# Patient Record
Sex: Female | Born: 1995 | Race: Black or African American | Hispanic: No | Marital: Single | Smoking: Never smoker
Health system: Southern US, Community
[De-identification: ages and names within clinical notes are randomized; demographics above are authoritative.]

## PROBLEM LIST (undated history)

## (undated) ENCOUNTER — Inpatient Hospital Stay (HOSPITAL_COMMUNITY): Payer: Self-pay

## (undated) DIAGNOSIS — A549 Gonococcal infection, unspecified: Secondary | ICD-10-CM

## (undated) DIAGNOSIS — J45909 Unspecified asthma, uncomplicated: Secondary | ICD-10-CM

## (undated) HISTORY — PX: WISDOM TOOTH EXTRACTION: SHX21

---

## 2017-08-12 ENCOUNTER — Encounter (HOSPITAL_COMMUNITY): Payer: Self-pay | Admitting: Emergency Medicine

## 2017-08-12 ENCOUNTER — Emergency Department (HOSPITAL_COMMUNITY)
Admission: EM | Admit: 2017-08-12 | Discharge: 2017-08-12 | Disposition: A | Discharge status: Home / Self Care | Payer: Self-pay | Attending: Emergency Medicine | Admitting: Emergency Medicine

## 2017-08-12 DIAGNOSIS — J020 Streptococcal pharyngitis: Secondary | ICD-10-CM | POA: Insufficient documentation

## 2017-08-12 LAB — RAPID STREP SCREEN (MED CTR MEBANE ONLY): Streptococcus, Group A Screen (Direct): POSITIVE — AB

## 2017-08-12 MED ORDER — PENICILLIN G BENZATHINE 1200000 UNIT/2ML IM SUSP
1.2000 10*6.[IU] | Freq: Once | INTRAMUSCULAR | Status: AC
  Administered 2017-08-12: 1.2 10*6.[IU] via INTRAMUSCULAR
  Filled 2017-08-12: qty 2

## 2017-08-12 NOTE — Discharge Instructions (Signed)
Return if any problems.

## 2017-08-12 NOTE — ED Provider Notes (Signed)
[facility] Provider Note   CSN: [id number] Arrival date & time: 08/12/17  1512     History   Chief Complaint Chief Complaint  Patient presents with  . Sore Throat    HPI Renee Glenn is a 21 y.o. female.  The history is provided by the patient. No language interpreter was used.  Sore Throat  This is a new problem. The current episode started 2 days ago. The problem occurs constantly. The problem has been gradually worsening. Nothing aggravates the symptoms. Nothing relieves the symptoms. She has tried nothing for the symptoms.  Pt here with friend who recently had strep  History reviewed. No pertinent past medical history.  There are no active problems to display for this patient.   Past Surgical History:  Procedure Laterality Date  . WISDOM TOOTH EXTRACTION      OB History    No data available       Home Medications    Prior to Admission medications   Not on File    Family History No family history on file.  Social History Social History  Substance Use Topics  . Smoking status: Not on file  . Smokeless tobacco: Not on file  . Alcohol use Not on file     Allergies   Patient has no known allergies.   Review of Systems Review of Systems  All other systems reviewed and are negative.    Physical Exam Updated Vital Signs BP 129/77 (BP Location: Left Arm)   Pulse 77   Temp 98.4 F (36.9 C) (Oral)   Resp 16   LMP 07/21/2017 (Approximate)   SpO2 100%   Physical Exam  Constitutional: She appears well-developed and well-nourished. No distress.  HENT:  Head: Normocephalic and atraumatic.  Right Ear: External ear normal.  Left Ear: External ear normal.  Nose: Nose normal.  Erythema throat, no exudate  Eyes: Conjunctivae are normal.  Neck: Neck supple.  Cardiovascular: Normal rate and regular rhythm.   No murmur heard. Pulmonary/Chest: Effort normal and breath sounds normal. No respiratory distress.  Abdominal: Soft. There is  no tenderness.  Musculoskeletal: She exhibits no edema.  Neurological: She is alert.  Skin: Skin is warm and dry.  Psychiatric: She has a normal mood and affect.  Nursing note and vitals reviewed.    ED Treatments / Results  Labs (all labs ordered are listed, but only abnormal results are displayed) Labs Reviewed  RAPID STREP SCREEN (NOT AT [facility]) - Abnormal; Notable for the following:       Result Value   Streptococcus, Group A Screen (Direct) POSITIVE (*)    All other components within normal limits    EKG  EKG Interpretation None       Radiology No results found.  Procedures Procedures (including critical care time)  Medications Ordered in ED Medications  penicillin g benzathine (BICILLIN LA) 1200000 UNIT/2ML injection 1.2 Million Units (1.2 Million Units Intramuscular Given 08/12/17 1817)     Initial Impression / Assessment and Plan / ED Course  I have reviewed the triage vital signs and the nursing notes.  Pertinent labs & imaging results that were available during my care of the patient were reviewed by me and considered in my medical decision making (see chart for details).       Final Clinical Impressions(s) / ED Diagnoses   Final diagnoses:  Strep pharyngitis   Meds ordered this encounter  Medications  . penicillin g benzathine (BICILLIN LA) 1200000 UNIT/2ML injection 1.2 Million Units  Order Specific Question:   Antibiotic Indication:    Answer:   Pharyngitis    New Prescriptions There are no discharge medications for this patient. An After Visit Summary was printed and given to the patient.   Elson Areas, PA-C 08/16/17 1608    Pricilla Loveless, MD 08/19/17 [phone]

## 2017-08-12 NOTE — ED Triage Notes (Signed)
Pt states that she has a friend that has strep and she thinks she has it now. Sore throat. Alert and oriented.

## 2017-12-24 NOTE — L&D Delivery Note (Addendum)
Patient: Renee ClintonShaitiyah Cowie MRN: [id number]  GBS status: negative, intrapartum antibiotics for chorio  Patient is a 22 y.o. now G1P1 s/p NSVD at 2763w6d, who was admitted for IOL due to SROM without SOL. SROM 76h 1062m prior to delivery with clear fluid.  Head delivered LOA. Nuchal cord x2 delivered through and reduced after delivery of the body. Infant with poor tone at delivery, clamped immediately and taken to warmer where she had spontaneous cry after suction and stimulation. Cord blood drawn. Placenta delivered spontaneously with gentle cord traction. Fundus firm with massage and Pitocin. Perineum inspected and found to have no laceration.  Delivery Note At 10:48 PM a viable female was delivered via Vaginal, Spontaneous (Presentation: cephalic; LOA ).  APGAR: 5, 8, 9; weight 3294g.   Placenta status: intact, 3 vessel cord. Sent to path to confirm chorio.    Anesthesia: Epidural  Episiotomy: None Lacerations: None Est. Blood Loss (mL): 292 mL   Mom to postpartum.  Baby to Couplet care / Skin to Skin. Resident was called back to room approximately 40 minutes after delivery for slow trickle of blood during fundal massage. Resolved by the time of arrival to room. Repeat exam without pooling of vaginal blood or lacerations.  Shawn RouteSarah A Peiffer 12/04/2018, 11:01 PM    OB FELLOW DELIVERY ATTESTATION  I was gloved and present for the delivery in its entirety, and I agree with the above resident's note.    Marcy Sirenatherine Wallace, D.O. OB Fellow  12/05/2018, 4:26 AM

## 2018-04-14 ENCOUNTER — Encounter (HOSPITAL_COMMUNITY): Payer: Self-pay

## 2018-04-14 ENCOUNTER — Ambulatory Visit (HOSPITAL_COMMUNITY)
Admission: EM | Admit: 2018-04-14 | Discharge: 2018-04-14 | Disposition: A | Discharge status: Home / Self Care | Payer: Self-pay | Attending: Family Medicine | Admitting: Family Medicine

## 2018-04-14 DIAGNOSIS — Z3A01 Less than 8 weeks gestation of pregnancy: Secondary | ICD-10-CM

## 2018-04-14 DIAGNOSIS — O210 Mild hyperemesis gravidarum: Secondary | ICD-10-CM

## 2018-04-14 DIAGNOSIS — O219 Vomiting of pregnancy, unspecified: Secondary | ICD-10-CM

## 2018-04-14 MED ORDER — ONDANSETRON 4 MG PO TBDP
4.0000 mg | ORAL_TABLET | Freq: Once | ORAL | Status: AC
  Administered 2018-04-14: 4 mg via ORAL

## 2018-04-14 MED ORDER — DOXYLAMINE SUCCINATE (SLEEP) 25 MG PO TABS: 12.5000 mg | ORAL_TABLET | Freq: Three times a day (TID) | ORAL | 0 refills | Status: DC | PRN

## 2018-04-14 MED ORDER — ONDANSETRON 4 MG PO TBDP
ORAL_TABLET | ORAL | Status: AC
  Filled 2018-04-14: qty 1

## 2018-04-14 MED ORDER — VITAMIN B-6 25 MG PO TABS: 25.0000 mg | ORAL_TABLET | Freq: Three times a day (TID) | ORAL | 0 refills | Status: DC | PRN

## 2018-04-14 NOTE — ED Provider Notes (Signed)
[facility]    CSN: [id number] Arrival date & time: 04/14/18  1038     History   Chief Complaint Chief Complaint  Patient presents with  . Emesis  . Routine Prenatal Visit    HPI Renee Glenn is a 22 y.o. female.   Floria presents with complaints of nausea and vomiting which have been persistent for the past week. She is approximately [redacted]w[redacted]d pregnant, LMP March 8. She states she has vomited approximately 12 times today. Causes her to feel short of breath at times. Upper abdomen feels sore from vomiting but otherwise with abdominal or pelvic pain. Without vaginal bleeding. Denies urinary symptoms and is still urinating regularly. Denies dizziness. Has been taking some fluids which stay down, has been drinking ginger ale. Has not taken any medications for symptoms. Has been taking a prenatal vitamin. This is her first pregnancy. Has first Ob appointment 5/13. Without contributing medical history.      ROS per HPI.      History reviewed. No pertinent past medical history.  There are no active problems to display for this patient.   Past Surgical History:  Procedure Laterality Date  . WISDOM TOOTH EXTRACTION      OB History    Gravida  1   Para      Term      Preterm      AB      Living        SAB      TAB      Ectopic      Multiple      Live Births               Home Medications    Prior to Admission medications   Medication Sig Start Date End Date Taking? Authorizing Provider  doxylamine, Sleep, (UNISOM) 25 MG tablet Take 0.5 tablets (12.5 mg total) by mouth 3 (three) times daily as needed. 04/14/18   Georgetta Haber, NP  vitamin B-6 (PYRIDOXINE) 25 MG tablet Take 1 tablet (25 mg total) by mouth 3 (three) times daily as needed. 04/14/18   Georgetta Haber, NP    Family History Family History  Problem Relation Age of Onset  . Healthy Mother   . Healthy Father     Social History Social History   Tobacco Use  . Smoking  status: Never Smoker  . Smokeless tobacco: Never Used  Substance Use Topics  . Alcohol use: Not Currently    Frequency: Never  . Drug use: Never     Allergies   Penicillins   Review of Systems Review of Systems   Physical Exam Triage Vital Signs ED Triage Vitals  Enc Vitals Group     BP 04/14/18 1141 136/80     Pulse Rate 04/14/18 1138 67     Resp 04/14/18 1138 18     Temp 04/14/18 1138 98.1 F (36.7 C)     Temp src --      SpO2 04/14/18 1138 100 %     Weight 04/14/18 1139 170 lb (77.1 kg)     Height --      Head Circumference --      Peak Flow --      Pain Score 04/14/18 1139 0     Pain Loc --      Pain Edu? --      Excl. in GC? --    No data found.  Updated Vital Signs BP 136/80   Pulse 67  Temp 98.1 F (36.7 C)   Resp 18   Wt 170 lb (77.1 kg)   LMP 07/21/2017 (Approximate)   SpO2 100%    Physical Exam  Constitutional: She is oriented to person, place, and time. She appears well-developed and well-nourished. No distress.  Cardiovascular: Normal rate, regular rhythm and normal heart sounds.  Pulmonary/Chest: Effort normal and breath sounds normal.  Abdominal: Soft. She exhibits no distension. There is no tenderness. There is no rigidity, no rebound, no guarding and no CVA tenderness.  Genitourinary:  Genitourinary Comments: Denies vaginal bleeding or discharge  Neurological: She is alert and oriented to person, place, and time.  Skin: Skin is warm and dry.     UC Treatments / Results  Labs (all labs ordered are listed, but only abnormal results are displayed) Labs Reviewed - No data to display  EKG None Radiology No results found.  Procedures Procedures (including critical care time)  Medications Ordered in UC Medications  ondansetron (ZOFRAN-ODT) disintegrating tablet 4 mg (has no administration in time range)     Initial Impression / Assessment and Plan / UC Course  I have reviewed the triage vital signs and the nursing  notes.  Pertinent labs & imaging results that were available during my care of the patient were reviewed by me and considered in my medical decision making (see chart for details).     Taking some fluids in clinic today. Ambulatory, without dizziness, vitals stable without tachycardia or tachypnea, normotensive. zofran provided in clinic today with recommendations for use of vitamin b6 and unisom for nausea at home. Return precautions provided. Continue to follow with Ob as already scheduled. Patient verbalized understanding and agreeable to plan.    Final Clinical Impressions(s) / UC Diagnoses   Final diagnoses:  Morning sickness    ED Discharge Orders        Ordered    doxylamine, Sleep, (UNISOM) 25 MG tablet  3 times daily PRN     04/14/18 1159    vitamin B-6 (PYRIDOXINE) 25 MG tablet  3 times daily PRN     04/14/18 1159       Controlled Substance Prescriptions [place] Controlled Substance Registry consulted? Not Applicable   Georgetta HaberBurky, Johnnisha Forton B, NP 04/14/18 1206

## 2018-04-14 NOTE — Discharge Instructions (Addendum)
For morning sickness: Doxylamine is available in some over-the-counter sleeping pills (eg, Unisom Sleep Tabs): One-half of the 25 mg over-the-counter tablet or two chewable 5 mg tablets can be used off-label as an antiemetic. In addition, pyridoxine (vitamin B6) 25 mg, also available over-the-counter, can be taken three or four times per day along with 12.5 mg of doxylamine   Small frequent sips of fluids.  See provided information for tips in regards to morning sickness. Tylenol as needed for pain. If worsening, develop dizziness, unable to urinate, weakness, pain, bleeding or otherwise worsening please go to er or womens er.

## 2018-04-14 NOTE — ED Triage Notes (Signed)
Pt presents six weeks pregnant. Complaints of constant vomiting x 1 week.

## 2018-04-15 ENCOUNTER — Encounter (HOSPITAL_COMMUNITY): Payer: Self-pay | Admitting: *Deleted

## 2018-04-15 ENCOUNTER — Other Ambulatory Visit: Payer: Self-pay

## 2018-04-15 ENCOUNTER — Inpatient Hospital Stay (HOSPITAL_COMMUNITY)
Admission: AD | Admit: 2018-04-15 | Discharge: 2018-04-15 | Disposition: A | Discharge status: Home / Self Care | Payer: Medicaid Other | Source: Ambulatory Visit | Attending: Obstetrics and Gynecology | Admitting: Obstetrics and Gynecology

## 2018-04-15 DIAGNOSIS — Z3A01 Less than 8 weeks gestation of pregnancy: Secondary | ICD-10-CM | POA: Insufficient documentation

## 2018-04-15 DIAGNOSIS — O210 Mild hyperemesis gravidarum: Secondary | ICD-10-CM | POA: Insufficient documentation

## 2018-04-15 DIAGNOSIS — O219 Vomiting of pregnancy, unspecified: Secondary | ICD-10-CM

## 2018-04-15 HISTORY — DX: Unspecified asthma, uncomplicated: J45.909

## 2018-04-15 LAB — URINALYSIS, ROUTINE W REFLEX MICROSCOPIC
BILIRUBIN URINE: NEGATIVE
GLUCOSE, UA: NEGATIVE mg/dL
Hgb urine dipstick: NEGATIVE
KETONES UR: 5 mg/dL — AB
LEUKOCYTES UA: NEGATIVE
Nitrite: NEGATIVE
PH: 7 (ref 5.0–8.0)
Protein, ur: 30 mg/dL — AB
SPECIFIC GRAVITY, URINE: 1.032 — AB (ref 1.005–1.030)

## 2018-04-15 LAB — BASIC METABOLIC PANEL
ANION GAP: 10 (ref 5–15)
BUN: 9 mg/dL (ref 6–20)
CO2: 20 mmol/L — AB (ref 22–32)
Calcium: 10 mg/dL (ref 8.9–10.3)
Chloride: 106 mmol/L (ref 101–111)
Creatinine, Ser: 0.72 mg/dL (ref 0.44–1.00)
GFR calc Af Amer: >60 mL/min (ref >60)
GLUCOSE: 104 mg/dL — AB (ref 65–99)
POTASSIUM: 4.3 mmol/L (ref 3.5–5.1)
Sodium: 136 mmol/L (ref 135–145)

## 2018-04-15 LAB — CBC
HEMATOCRIT: 39.1 % (ref 36.0–46.0)
Hemoglobin: 13.3 g/dL (ref 12.0–15.0)
MCH: 27.4 pg (ref 26.0–34.0)
MCHC: 34 g/dL (ref 30.0–36.0)
MCV: 80.6 fL (ref 78.0–100.0)
PLATELETS: 247 10*3/uL (ref 150–400)
RBC: 4.85 MIL/uL (ref 3.87–5.11)
RDW: 14.4 % (ref 11.5–15.5)
WBC: 14.1 10*3/uL — AB (ref 4.0–10.5)

## 2018-04-15 LAB — POCT PREGNANCY, URINE: Preg Test, Ur: POSITIVE — AB

## 2018-04-15 MED ORDER — PROMETHAZINE HCL 25 MG PO TABS: 25.0000 mg | ORAL_TABLET | Freq: Four times a day (QID) | ORAL | 0 refills | Status: DC | PRN

## 2018-04-15 MED ORDER — RANITIDINE HCL 150 MG PO TABS: 150.0000 mg | ORAL_TABLET | Freq: Two times a day (BID) | ORAL | 0 refills | Status: DC

## 2018-04-15 MED ORDER — LACTATED RINGERS IV BOLUS
1000.0000 mL | Freq: Once | INTRAVENOUS | Status: AC
  Administered 2018-04-15: 1000 mL via INTRAVENOUS

## 2018-04-15 MED ORDER — PROMETHAZINE HCL 25 MG/ML IJ SOLN
25.0000 mg | Freq: Once | INTRAMUSCULAR | Status: AC
  Administered 2018-04-15: 25 mg via INTRAVENOUS
  Filled 2018-04-15: qty 1

## 2018-04-15 MED ORDER — FAMOTIDINE IN NACL 20-0.9 MG/50ML-% IV SOLN
20.0000 mg | Freq: Once | INTRAVENOUS | Status: AC
  Administered 2018-04-15: 20 mg via INTRAVENOUS
  Filled 2018-04-15: qty 50

## 2018-04-15 NOTE — Discharge Instructions (Signed)
Morning Sickness °Morning sickness is when you feel sick to your stomach (nauseous) during pregnancy. This nauseous feeling may or may not come with vomiting. It often occurs in the morning but can be a problem any time of day. Morning sickness is most common during the first trimester, but it may continue throughout pregnancy. While morning sickness is unpleasant, it is usually harmless unless you develop severe and continual vomiting (hyperemesis gravidarum). This condition requires more intense treatment. °What are the causes? °The cause of morning sickness is not completely known but seems to be related to normal hormonal changes that occur in pregnancy. °What increases the risk? °You are at greater risk if you: °· Experienced nausea or vomiting before your pregnancy. °· Had morning sickness during a previous pregnancy. °· Are pregnant with more than one baby, such as twins. ° °How is this treated? °Do not use any medicines (prescription, over-the-counter, or herbal) for morning sickness without first talking to your health care provider. Your health care provider may prescribe or recommend: °· Vitamin B6 supplements. °· Anti-nausea medicines. °· The herbal medicine ginger. ° °Follow these instructions at home: °· Only take over-the-counter or prescription medicines as directed by your health care provider. °· Taking multivitamins before getting pregnant can prevent or decrease the severity of morning sickness in most women. °· Eat a piece of dry toast or unsalted crackers before getting out of bed in the morning. °· Eat five or six small meals a day. °· Eat dry and bland foods (rice, baked potato). Foods high in carbohydrates are often helpful. °· Do not drink liquids with your meals. Drink liquids between meals. °· Avoid greasy, fatty, and spicy foods. °· Get someone to cook for you if the smell of any food causes nausea and vomiting. °· If you feel nauseous after taking prenatal vitamins, take the vitamins at  night or with a snack. °· Snack on protein foods (nuts, yogurt, cheese) between meals if you are hungry. °· Eat unsweetened gelatins for desserts. °· Wearing an acupressure wristband (worn for sea sickness) may be helpful. °· Acupuncture may be helpful. °· Do not smoke. °· Get a humidifier to keep the air in your house free of odors. °· Get plenty of fresh air. °Contact a health care provider if: °· Your home remedies are not working, and you need medicine. °· You feel dizzy or lightheaded. °· You are losing weight. °Get help right away if: °· You have persistent and uncontrolled nausea and vomiting. °· You pass out (faint). °This information is not intended to replace advice given to you by your health care provider. Make sure you discuss any questions you have with your health care provider. °Document Released: 01/31/2007 Document Revised: 05/17/2016 Document Reviewed: 05/27/2013 °Elsevier Interactive Patient Education © 2017 [employer] ° °

## 2018-04-15 NOTE — MAU Provider Note (Signed)
History     CSN: [id number]  Arrival date and time: 04/15/18 1813   First Provider Initiated Contact with Patient 04/15/18 2019      Chief Complaint  Patient presents with  . Emesis  . Nausea   HPI Renee Glenn is a 22 y.o. G1P0 at 8527w3d by LMP who presents with nausea/vomiting. Symptoms began last week after finding out she was pregnant. Reports vomiting daily. Went to the ED the other day & was told to take unisom for symptoms. Unisom has not helped. Has vomited 7 times today. Last ate last night; had steak, potatoes, and corn but vomited after.  Denies abdominal pain, fever/chills, vaginal bleeding, or diarrhea.   OB History    Gravida  1   Para      Term      Preterm      AB      Living        SAB      TAB      Ectopic      Multiple      Live Births              Past Medical History:  Diagnosis Date  . Asthma     Past Surgical History:  Procedure Laterality Date  . WISDOM TOOTH EXTRACTION      Family History  Problem Relation Age of Onset  . Healthy Mother   . Healthy Father     Social History   Tobacco Use  . Smoking status: Never Smoker  . Smokeless tobacco: Never Used  Substance Use Topics  . Alcohol use: Not Currently    Frequency: Never    Comment: 3 weeks ago  . Drug use: Yes    Types: Marijuana    Comment: last use 2 weeks ago    Allergies:  Allergies  Allergen Reactions  . Penicillins Rash    Has patient had a PCN reaction causing immediate rash, facial/tongue/throat swelling, SOB or lightheadedness with hypotension: no Has patient had a PCN reaction causing severe rash involving mucus membranes or skin necrosis: no Has patient had a PCN reaction that required hospitalization: no Has patient had a PCN reaction occurring within the last 10 years: yes If all of the above answers are "NO", then may proceed with Cephalosporin use.     Medications Prior to Admission  Medication Sig Dispense Refill Last Dose  .  acetaminophen (TYLENOL) 500 MG tablet Take 500 mg by mouth every 6 (six) hours as needed for moderate pain.   Past Week at Unknown time  . doxylamine, Sleep, (UNISOM) 25 MG tablet Take 0.5 tablets (12.5 mg total) by mouth 3 (three) times daily as needed. (Patient taking differently: Take 12.5 mg by mouth 3 (three) times daily as needed for sleep. ) 30 tablet 0 04/15/2018 at Unknown time  . Prenatal Vit-Fe Fumarate-FA (PRENATAL MULTIVITAMIN) TABS tablet Take 1 tablet by mouth daily at 12 noon.   04/15/2018 at Unknown time  . vitamin B-6 (PYRIDOXINE) 25 MG tablet Take 1 tablet (25 mg total) by mouth 3 (three) times daily as needed. (Patient not taking: Reported on 04/15/2018) 60 tablet 0 More than a month at Unknown time    Review of Systems  Constitutional: Negative.   Gastrointestinal: Positive for nausea and vomiting. Negative for abdominal pain, constipation and diarrhea.  Genitourinary: Negative.    Physical Exam   Blood pressure 133/64, pulse 66, temperature 99.6 F (37.6 C), temperature source Oral, resp. rate 16, weight 171  lb (77.6 kg), last menstrual period 02/28/2018, SpO2 100 %.  Physical Exam  Nursing note and vitals reviewed. Constitutional: She is oriented to person, place, and time. She appears well-developed and well-nourished. No distress.  HENT:  Head: Normocephalic and atraumatic.  Eyes: Conjunctivae are normal. Right eye exhibits no discharge. Left eye exhibits no discharge. No scleral icterus.  Neck: Normal range of motion.  Respiratory: Effort normal. No respiratory distress.  GI: Soft. She exhibits no distension. There is no tenderness. There is no guarding.  Neurological: She is alert and oriented to person, place, and time.  Skin: Skin is warm and dry. She is not diaphoretic.  Psychiatric: She has a normal mood and affect. Her behavior is normal. Judgment and thought content normal.    MAU Course  Procedures Results for orders placed or performed during the  hospital encounter of 04/15/18 (from the past 24 hour(s))  Urinalysis, Routine w reflex microscopic     Status: Abnormal   Collection Time: 04/15/18  7:00 PM  Result Value Ref Range   Color, Urine AMBER (A) YELLOW   APPearance HAZY (A) CLEAR   Specific Gravity, Urine 1.032 (H) 1.005 - 1.030   pH 7.0 5.0 - 8.0   Glucose, UA NEGATIVE NEGATIVE mg/dL   Hgb urine dipstick NEGATIVE NEGATIVE   Bilirubin Urine NEGATIVE NEGATIVE   Ketones, ur 5 (A) NEGATIVE mg/dL   Protein, ur 30 (A) NEGATIVE mg/dL   Nitrite NEGATIVE NEGATIVE   Leukocytes, UA NEGATIVE NEGATIVE   RBC / HPF 0-5 0 - 5 RBC/hpf   WBC, UA 0-5 0 - 5 WBC/hpf   Bacteria, UA RARE (A) NONE SEEN   Squamous Epithelial / LPF 11-20 0 - 5   Mucus PRESENT   Pregnancy, urine POC     Status: Abnormal   Collection Time: 04/15/18  7:13 PM  Result Value Ref Range   Preg Test, Ur POSITIVE (A) NEGATIVE  CBC     Status: Abnormal   Collection Time: 04/15/18  8:30 PM  Result Value Ref Range   WBC 14.1 (H) 4.0 - 10.5 K/uL   RBC 4.85 3.87 - 5.11 MIL/uL   Hemoglobin 13.3 12.0 - 15.0 g/dL   HCT 16.1 09.6 - 04.5 %   MCV 80.6 78.0 - 100.0 fL   MCH 27.4 26.0 - 34.0 pg   MCHC 34.0 30.0 - 36.0 g/dL   RDW 40.9 81.1 - 91.4 %   Platelets 247 150 - 400 K/uL  Basic metabolic panel     Status: Abnormal   Collection Time: 04/15/18  8:30 PM  Result Value Ref Range   Sodium 136 135 - 145 mmol/L   Potassium 4.3 3.5 - 5.1 mmol/L   Chloride 106 101 - 111 mmol/L   CO2 20 (L) 22 - 32 mmol/L   Glucose, Bld 104 (H) 65 - 99 mg/dL   BUN 9 6 - 20 mg/dL   Creatinine, Ser 7.82 0.44 - 1.00 mg/dL   Calcium 95.6 8.9 - 21.3 mg/dL   GFR calc non Af Amer >60 >60 mL/min   GFR calc Af Amer >60 >60 mL/min   Anion gap 10 5 - 15    MDM High specific gravity & ketones on u/a. Will give IV fluids & phenergan. Pt reports improvement in symptoms after meds. Tolerates ginger ale & ice chips.  Assessment and Plan  A: 1. Nausea and vomiting during pregnancy prior to [redacted] weeks  gestation    P: Discharge home Rx phenergan & zantac Start  prenatal care Discussed reasons to return to MAU   Judeth Horn 04/15/2018, 8:25 PM

## 2018-04-15 NOTE — MAU Note (Signed)
Ongoing vomiting for the past wk. Can't do anything.  Was at [facility] yesterday, was given medication- but not a prescription, advised to come here.  First appt is 5/13

## 2018-05-08 LAB — OB RESULTS CONSOLE ANTIBODY SCREEN: Antibody Screen: NEGATIVE

## 2018-05-08 LAB — OB RESULTS CONSOLE HEPATITIS B SURFACE ANTIGEN: Hepatitis B Surface Ag: NEGATIVE

## 2018-05-08 LAB — OB RESULTS CONSOLE GC/CHLAMYDIA
Chlamydia: NEGATIVE
GC PROBE AMP, GENITAL: NEGATIVE

## 2018-05-08 LAB — OB RESULTS CONSOLE HIV ANTIBODY (ROUTINE TESTING): HIV: NONREACTIVE

## 2018-05-08 LAB — OB RESULTS CONSOLE RPR: RPR: NONREACTIVE

## 2018-05-08 LAB — OB RESULTS CONSOLE ABO/RH: RH Type: POSITIVE

## 2018-05-08 LAB — OB RESULTS CONSOLE RUBELLA ANTIBODY, IGM: Rubella: NON-IMMUNE/NOT IMMUNE

## 2018-05-09 ENCOUNTER — Other Ambulatory Visit (HOSPITAL_COMMUNITY): Payer: Self-pay | Admitting: Nurse Practitioner

## 2018-05-09 DIAGNOSIS — Z369 Encounter for antenatal screening, unspecified: Secondary | ICD-10-CM

## 2018-05-22 ENCOUNTER — Encounter (HOSPITAL_COMMUNITY): Payer: Self-pay

## 2018-05-29 ENCOUNTER — Encounter (HOSPITAL_COMMUNITY): Payer: Self-pay | Admitting: *Deleted

## 2018-05-30 ENCOUNTER — Encounter (HOSPITAL_COMMUNITY): Payer: Self-pay

## 2018-05-30 ENCOUNTER — Ambulatory Visit (HOSPITAL_COMMUNITY)
Admission: RE | Admit: 2018-05-30 | Discharge: 2018-05-30 | Disposition: A | Discharge status: Home / Self Care | Payer: Medicaid Other | Source: Ambulatory Visit | Attending: Nurse Practitioner | Admitting: Nurse Practitioner

## 2018-05-30 DIAGNOSIS — Z3A13 13 weeks gestation of pregnancy: Secondary | ICD-10-CM | POA: Insufficient documentation

## 2018-05-30 DIAGNOSIS — O9989 Other specified diseases and conditions complicating pregnancy, childbirth and the puerperium: Secondary | ICD-10-CM | POA: Insufficient documentation

## 2018-05-30 DIAGNOSIS — Z369 Encounter for antenatal screening, unspecified: Secondary | ICD-10-CM

## 2018-05-30 DIAGNOSIS — J45909 Unspecified asthma, uncomplicated: Secondary | ICD-10-CM | POA: Diagnosis not present

## 2018-05-30 DIAGNOSIS — Z3682 Encounter for antenatal screening for nuchal translucency: Secondary | ICD-10-CM | POA: Insufficient documentation

## 2018-05-30 HISTORY — DX: Gonococcal infection, unspecified: A54.9

## 2018-06-27 ENCOUNTER — Other Ambulatory Visit (HOSPITAL_COMMUNITY): Payer: Self-pay

## 2018-08-13 ENCOUNTER — Other Ambulatory Visit (HOSPITAL_COMMUNITY): Payer: Self-pay | Admitting: Nurse Practitioner

## 2018-08-13 DIAGNOSIS — O26842 Uterine size-date discrepancy, second trimester: Secondary | ICD-10-CM

## 2018-08-19 ENCOUNTER — Other Ambulatory Visit (HOSPITAL_COMMUNITY): Payer: Self-pay | Admitting: *Deleted

## 2018-08-19 ENCOUNTER — Other Ambulatory Visit (HOSPITAL_COMMUNITY): Payer: Self-pay | Admitting: Nurse Practitioner

## 2018-08-19 ENCOUNTER — Ambulatory Visit (HOSPITAL_COMMUNITY)
Admission: RE | Admit: 2018-08-19 | Discharge: 2018-08-19 | Disposition: A | Discharge status: Home / Self Care | Payer: Medicaid Other | Source: Ambulatory Visit | Attending: Family | Admitting: Family

## 2018-08-19 DIAGNOSIS — O26842 Uterine size-date discrepancy, second trimester: Secondary | ICD-10-CM | POA: Diagnosis not present

## 2018-08-19 DIAGNOSIS — Z363 Encounter for antenatal screening for malformations: Secondary | ICD-10-CM

## 2018-08-19 DIAGNOSIS — Z3A24 24 weeks gestation of pregnancy: Secondary | ICD-10-CM | POA: Diagnosis not present

## 2018-08-19 DIAGNOSIS — Z362 Encounter for other antenatal screening follow-up: Secondary | ICD-10-CM

## 2018-08-21 ENCOUNTER — Other Ambulatory Visit (HOSPITAL_COMMUNITY): Payer: Self-pay | Admitting: Nurse Practitioner

## 2018-08-21 DIAGNOSIS — O26842 Uterine size-date discrepancy, second trimester: Secondary | ICD-10-CM

## 2018-08-28 ENCOUNTER — Encounter: Payer: Medicaid Other | Admitting: Obstetrics & Gynecology

## 2018-09-16 ENCOUNTER — Ambulatory Visit (HOSPITAL_COMMUNITY)
Admission: RE | Admit: 2018-09-16 | Discharge: 2018-09-16 | Disposition: A | Discharge status: Home / Self Care | Payer: Medicaid Other | Source: Ambulatory Visit | Attending: Family | Admitting: Family

## 2018-09-16 ENCOUNTER — Encounter (HOSPITAL_COMMUNITY): Payer: Self-pay

## 2018-09-16 ENCOUNTER — Ambulatory Visit (HOSPITAL_COMMUNITY): Payer: Medicaid Other

## 2018-09-16 DIAGNOSIS — O99513 Diseases of the respiratory system complicating pregnancy, third trimester: Secondary | ICD-10-CM | POA: Diagnosis not present

## 2018-09-16 DIAGNOSIS — Z362 Encounter for other antenatal screening follow-up: Secondary | ICD-10-CM | POA: Diagnosis not present

## 2018-09-16 DIAGNOSIS — Z3A28 28 weeks gestation of pregnancy: Secondary | ICD-10-CM | POA: Insufficient documentation

## 2018-09-16 DIAGNOSIS — J45909 Unspecified asthma, uncomplicated: Secondary | ICD-10-CM | POA: Diagnosis not present

## 2018-11-12 LAB — OB RESULTS CONSOLE GC/CHLAMYDIA
Chlamydia: NEGATIVE
Gonorrhea: NEGATIVE

## 2018-11-12 LAB — OB RESULTS CONSOLE GBS: GBS: NEGATIVE

## 2018-11-17 ENCOUNTER — Encounter (HOSPITAL_COMMUNITY): Payer: Self-pay | Admitting: *Deleted

## 2018-11-17 ENCOUNTER — Inpatient Hospital Stay (HOSPITAL_COMMUNITY)
Admission: AD | Admit: 2018-11-17 | Discharge: 2018-11-17 | Disposition: A | Discharge status: Home / Self Care | Payer: Medicaid Other | Source: Ambulatory Visit | Attending: Obstetrics and Gynecology | Admitting: Obstetrics and Gynecology

## 2018-11-17 DIAGNOSIS — O26893 Other specified pregnancy related conditions, third trimester: Secondary | ICD-10-CM | POA: Diagnosis not present

## 2018-11-17 DIAGNOSIS — J45909 Unspecified asthma, uncomplicated: Secondary | ICD-10-CM | POA: Insufficient documentation

## 2018-11-17 DIAGNOSIS — Z041 Encounter for examination and observation following transport accident: Secondary | ICD-10-CM | POA: Diagnosis not present

## 2018-11-17 DIAGNOSIS — Y92410 Unspecified street and highway as the place of occurrence of the external cause: Secondary | ICD-10-CM | POA: Insufficient documentation

## 2018-11-17 DIAGNOSIS — Z79899 Other long term (current) drug therapy: Secondary | ICD-10-CM | POA: Insufficient documentation

## 2018-11-17 DIAGNOSIS — Z3A37 37 weeks gestation of pregnancy: Secondary | ICD-10-CM | POA: Insufficient documentation

## 2018-11-17 DIAGNOSIS — O9A213 Injury, poisoning and certain other consequences of external causes complicating pregnancy, third trimester: Secondary | ICD-10-CM | POA: Diagnosis not present

## 2018-11-17 DIAGNOSIS — O99513 Diseases of the respiratory system complicating pregnancy, third trimester: Secondary | ICD-10-CM | POA: Diagnosis not present
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
## 2018-12-03 ENCOUNTER — Encounter (HOSPITAL_COMMUNITY): Payer: Self-pay | Admitting: *Deleted

## 2018-12-03 ENCOUNTER — Inpatient Hospital Stay (HOSPITAL_COMMUNITY)
Admission: AD | Admit: 2018-12-03 | Discharge: 2018-12-06 | DRG: 805 | Disposition: A | Discharge status: Home / Self Care | Payer: Medicaid Other | Attending: Obstetrics and Gynecology | Admitting: Obstetrics and Gynecology

## 2018-12-03 DIAGNOSIS — O99214 Obesity complicating childbirth: Secondary | ICD-10-CM | POA: Diagnosis present

## 2018-12-03 DIAGNOSIS — Z3A39 39 weeks gestation of pregnancy: Secondary | ICD-10-CM

## 2018-12-03 DIAGNOSIS — Z8759 Personal history of other complications of pregnancy, childbirth and the puerperium: Secondary | ICD-10-CM | POA: Diagnosis present

## 2018-12-03 DIAGNOSIS — O411230 Chorioamnionitis, third trimester, not applicable or unspecified: Secondary | ICD-10-CM | POA: Diagnosis present

## 2018-12-03 DIAGNOSIS — D649 Anemia, unspecified: Secondary | ICD-10-CM | POA: Diagnosis present

## 2018-12-03 DIAGNOSIS — O134 Gestational [pregnancy-induced] hypertension without significant proteinuria, complicating childbirth: Secondary | ICD-10-CM | POA: Diagnosis present

## 2018-12-03 DIAGNOSIS — Z880 Allergy status to penicillin: Secondary | ICD-10-CM | POA: Diagnosis not present

## 2018-12-03 DIAGNOSIS — O9902 Anemia complicating childbirth: Secondary | ICD-10-CM | POA: Diagnosis present

## 2018-12-03 DIAGNOSIS — O4292 Full-term premature rupture of membranes, unspecified as to length of time between rupture and onset of labor: Principal | ICD-10-CM | POA: Diagnosis present

## 2018-12-03 DIAGNOSIS — O139 Gestational [pregnancy-induced] hypertension without significant proteinuria, unspecified trimester: Secondary | ICD-10-CM | POA: Diagnosis present

## 2018-12-03 DIAGNOSIS — O9A213 Injury, poisoning and certain other consequences of external causes complicating pregnancy, third trimester: Secondary | ICD-10-CM

## 2018-12-03 DIAGNOSIS — O4290 Premature rupture of membranes, unspecified as to length of time between rupture and onset of labor, unspecified weeks of gestation: Secondary | ICD-10-CM | POA: Diagnosis present

## 2018-12-03 LAB — COMPREHENSIVE METABOLIC PANEL
ALT: 14 U/L (ref 0–44)
AST: 19 U/L (ref 15–41)
Albumin: 3.4 g/dL — ABNORMAL LOW (ref 3.5–5.0)
Alkaline Phosphatase: 77 U/L (ref 38–126)
Anion gap: 7 (ref 5–15)
BUN: 8 mg/dL (ref 6–20)
CHLORIDE: 104 mmol/L (ref 98–111)
CO2: 22 mmol/L (ref 22–32)
Calcium: 9 mg/dL (ref 8.9–10.3)
Creatinine, Ser: 0.71 mg/dL (ref 0.44–1.00)
GFR calc Af Amer: >60 mL/min (ref >60)
GFR calc non Af Amer: >60 mL/min (ref >60)
Glucose, Bld: 77 mg/dL (ref 70–99)
POTASSIUM: 4.3 mmol/L (ref 3.5–5.1)
Sodium: 133 mmol/L — ABNORMAL LOW (ref 135–145)
Total Bilirubin: 0.5 mg/dL (ref 0.3–1.2)
Total Protein: 6.6 g/dL (ref 6.5–8.1)

## 2018-12-03 LAB — CBC
HCT: 35.2 % — ABNORMAL LOW (ref 36.0–46.0)
HEMOGLOBIN: 11.3 g/dL — AB (ref 12.0–15.0)
MCH: 26.4 pg (ref 26.0–34.0)
MCHC: 32.1 g/dL (ref 30.0–36.0)
MCV: 82.2 fL (ref 80.0–100.0)
Platelets: 239 10*3/uL (ref 150–400)
RBC: 4.28 MIL/uL (ref 3.87–5.11)
RDW: 14.5 % (ref 11.5–15.5)
WBC: 10.4 10*3/uL (ref 4.0–10.5)
nRBC: 0 % (ref 0.0–0.2)

## 2018-12-03 LAB — TYPE AND SCREEN
ABO/RH(D): O POS
Antibody Screen: NEGATIVE

## 2018-12-03 LAB — ABO/RH: ABO/RH(D): O POS

## 2018-12-03 MED ORDER — ONDANSETRON HCL 4 MG/2ML IJ SOLN
4.0000 mg | Freq: Four times a day (QID) | INTRAMUSCULAR | Status: DC | PRN
  Administered 2018-12-04: 4 mg via INTRAVENOUS
  Filled 2018-12-03: qty 2

## 2018-12-03 MED ORDER — TERBUTALINE SULFATE 1 MG/ML IJ SOLN
0.2500 mg | Freq: Once | INTRAMUSCULAR | Status: DC | PRN
  Filled 2018-12-03: qty 1

## 2018-12-03 MED ORDER — FENTANYL CITRATE (PF) 100 MCG/2ML IJ SOLN
100.0000 ug | INTRAMUSCULAR | Status: DC | PRN
  Administered 2018-12-03 – 2018-12-04 (×8): 100 ug via INTRAVENOUS
  Filled 2018-12-03 (×8): qty 2

## 2018-12-03 MED ORDER — OXYTOCIN 40 UNITS IN LACTATED RINGERS INFUSION - SIMPLE MED
2.5000 [IU]/h | INTRAVENOUS | Status: DC
  Administered 2018-12-04: 2.5 [IU]/h via INTRAVENOUS

## 2018-12-03 MED ORDER — MISOPROSTOL 50MCG HALF TABLET
50.0000 ug | ORAL_TABLET | ORAL | Status: DC | PRN
  Administered 2018-12-03: 50 ug via BUCCAL
  Filled 2018-12-03 (×3): qty 1

## 2018-12-03 MED ORDER — OXYTOCIN BOLUS FROM INFUSION
500.0000 mL | Freq: Once | INTRAVENOUS | Status: AC
  Administered 2018-12-04: 500 mL via INTRAVENOUS

## 2018-12-03 MED ORDER — SOD CITRATE-CITRIC ACID 500-334 MG/5ML PO SOLN: 30.0000 mL | ORAL | Status: DC | PRN

## 2018-12-03 MED ORDER — FLEET ENEMA 7-19 GM/118ML RE ENEM: 1.0000 | ENEMA | RECTAL | Status: DC | PRN

## 2018-12-03 MED ORDER — LACTATED RINGERS IV SOLN
500.0000 mL | INTRAVENOUS | Status: DC | PRN
  Administered 2018-12-04 (×2): 500 mL via INTRAVENOUS

## 2018-12-03 MED ORDER — LIDOCAINE HCL (PF) 1 % IJ SOLN
30.0000 mL | INTRAMUSCULAR | Status: DC | PRN
  Filled 2018-12-03: qty 30

## 2018-12-03 MED ORDER — LACTATED RINGERS IV SOLN
INTRAVENOUS | Status: DC
  Administered 2018-12-03 – 2018-12-04 (×4): via INTRAVENOUS
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
## 2018-12-04 ENCOUNTER — Inpatient Hospital Stay (HOSPITAL_COMMUNITY): Payer: Medicaid Other | Admitting: Anesthesiology

## 2018-12-04 DIAGNOSIS — Z3A39 39 weeks gestation of pregnancy: Secondary | ICD-10-CM

## 2018-12-04 DIAGNOSIS — O139 Gestational [pregnancy-induced] hypertension without significant proteinuria, unspecified trimester: Secondary | ICD-10-CM | POA: Diagnosis present

## 2018-12-04 DIAGNOSIS — O4290 Premature rupture of membranes, unspecified as to length of time between rupture and onset of labor, unspecified weeks of gestation: Secondary | ICD-10-CM | POA: Diagnosis present

## 2018-12-04 DIAGNOSIS — Z8759 Personal history of other complications of pregnancy, childbirth and the puerperium: Secondary | ICD-10-CM | POA: Diagnosis present

## 2018-12-04 LAB — RPR: RPR: NONREACTIVE

## 2018-12-04 MED ORDER — TERBUTALINE SULFATE 1 MG/ML IJ SOLN
0.2500 mg | Freq: Once | INTRAMUSCULAR | Status: DC | PRN
  Filled 2018-12-04: qty 1

## 2018-12-04 MED ORDER — PHENYLEPHRINE 40 MCG/ML (10ML) SYRINGE FOR IV PUSH (FOR BLOOD PRESSURE SUPPORT)
80.0000 ug | PREFILLED_SYRINGE | INTRAVENOUS | Status: DC | PRN
  Filled 2018-12-04 (×2): qty 10

## 2018-12-04 MED ORDER — CLINDAMYCIN PHOSPHATE 900 MG/50ML IV SOLN
900.0000 mg | Freq: Three times a day (TID) | INTRAVENOUS | Status: DC
  Administered 2018-12-04 (×2): 900 mg via INTRAVENOUS
  Filled 2018-12-04 (×4): qty 50

## 2018-12-04 MED ORDER — GENTAMICIN SULFATE 40 MG/ML IJ SOLN
5.0000 mg/kg | INTRAVENOUS | Status: DC
  Administered 2018-12-04: 370 mg via INTRAVENOUS
  Filled 2018-12-04 (×2): qty 9.25

## 2018-12-04 MED ORDER — FENTANYL 2.5 MCG/ML BUPIVACAINE 1/10 % EPIDURAL INFUSION (WH - ANES)
14.0000 mL/h | INTRAMUSCULAR | Status: DC | PRN
  Administered 2018-12-04 (×2): 14 mL/h via EPIDURAL
  Filled 2018-12-04 (×2): qty 100

## 2018-12-04 MED ORDER — ACETAMINOPHEN 500 MG PO TABS
1000.0000 mg | ORAL_TABLET | Freq: Four times a day (QID) | ORAL | Status: DC | PRN
  Administered 2018-12-04 (×2): 1000 mg via ORAL
  Filled 2018-12-04 (×2): qty 2

## 2018-12-04 MED ORDER — LACTATED RINGERS IV SOLN: 500.0000 mL | Freq: Once | INTRAVENOUS | Status: DC

## 2018-12-04 MED ORDER — LACTATED RINGERS IV BOLUS: 500.0000 mL | Freq: Once | INTRAVENOUS | Status: DC

## 2018-12-04 MED ORDER — EPHEDRINE 5 MG/ML INJ
10.0000 mg | INTRAVENOUS | Status: DC | PRN
  Filled 2018-12-04: qty 2

## 2018-12-04 MED ORDER — LIDOCAINE HCL (PF) 1 % IJ SOLN
INTRAMUSCULAR | Status: DC | PRN
  Administered 2018-12-04 (×2): 4 mL via EPIDURAL

## 2018-12-04 MED ORDER — PHENYLEPHRINE 40 MCG/ML (10ML) SYRINGE FOR IV PUSH (FOR BLOOD PRESSURE SUPPORT)
80.0000 ug | PREFILLED_SYRINGE | INTRAVENOUS | Status: DC | PRN
  Filled 2018-12-04: qty 10

## 2018-12-04 MED ORDER — DIPHENHYDRAMINE HCL 50 MG/ML IJ SOLN: 12.5000 mg | INTRAMUSCULAR | Status: DC | PRN

## 2018-12-04 MED ORDER — LACTATED RINGERS AMNIOINFUSION
INTRAVENOUS | Status: DC
  Administered 2018-12-04: 21:00:00 via INTRAUTERINE
  Filled 2018-12-04 (×3): qty 1000

## 2018-12-04 MED ORDER — OXYTOCIN 40 UNITS IN LACTATED RINGERS INFUSION - SIMPLE MED
1.0000 m[IU]/min | INTRAVENOUS | Status: DC
  Administered 2018-12-04: 2 m[IU]/min via INTRAVENOUS
  Filled 2018-12-04: qty 1000

## 2018-12-04 NOTE — Anesthesia Procedure Notes (Signed)
Epidural Patient location during procedure: OB Start time: 12/04/2018 9:13 AM End time: 12/04/2018 9:22 AM  Staffing Anesthesiologist: Mal AmabileFoster, Kierra Jezewski, MD Performed: anesthesiologist   Preanesthetic Checklist Completed: patient identified, site marked, surgical consent, pre-op evaluation, timeout performed, IV checked, risks and benefits discussed and monitors and equipment checked  Epidural Patient position: sitting Prep: site prepped and draped and DuraPrep Patient monitoring: continuous pulse ox and blood pressure Approach: midline Location: L3-L4 Injection technique: LOR air  Needle:  Needle type: Tuohy  Needle gauge: 17 G Needle length: 9 cm and 9 Needle insertion depth: 8 cm Catheter type: closed end flexible Catheter size: 19 Gauge Catheter at skin depth: 13 cm Test dose: negative and Other  Assessment Events: blood not aspirated, injection not painful, no injection resistance, negative IV test and no paresthesia  Additional Notes Patient identified. Risks and benefits discussed including failed block, incomplete  Pain control, post dural puncture headache, nerve damage, paralysis, blood pressure Changes, nausea, vomiting, reactions to medications-both toxic and allergic and post Partum back pain. All questions were answered. Patient expressed understanding and wished to proceed. Sterile technique was used throughout procedure. Epidural site was Dressed with sterile barrier dressing. No paresthesias, signs of intravascular injection Or signs of intrathecal spread were encountered.  Patient was more comfortable after the epidural was dosed. Please see RN's note for documentation of vital signs and FHR which are stable.

## 2018-12-04 NOTE — Progress Notes (Signed)
Met patient, dicussed plan for the day. She was in the room with anesthesia getting Epidural. Progressing well.  -Carilyn Goodpasture MS3
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
## 2018-12-04 NOTE — Anesthesia Preprocedure Evaluation (Signed)
Anesthesia Evaluation  Patient identified by MRN, date of birth, ID band Patient awake    Reviewed: Allergy & Precautions, Patient's Chart, lab work & pertinent test results  Airway Mallampati: III  TM Distance: >3 FB Neck ROM: Full   Comment: Pierced nose and tongue Dental no notable dental hx. (+) Teeth Intact   Pulmonary asthma ,    breath sounds clear to auscultation       Cardiovascular negative cardio ROS Normal cardiovascular exam Rhythm:Regular Rate:Normal     Neuro/Psych negative neurological ROS  negative psych ROS   GI/Hepatic Neg liver ROS, GERD  ,  Endo/Other  Obesity  Renal/GU negative Renal ROS  negative genitourinary   Musculoskeletal negative musculoskeletal ROS (+)   Abdominal (+) + obese,   Peds  Hematology  (+) anemia ,   Anesthesia Other Findings   Reproductive/Obstetrics (+) Pregnancy                             Anesthesia Physical Anesthesia Plan  ASA: II  Anesthesia Plan: Epidural   Post-op Pain Management:    Induction:   PONV Risk Score and Plan:   Airway Management Planned: Natural Airway  Additional Equipment:   Intra-op Plan:   Post-operative Plan:   Informed Consent: I have reviewed the patients History and Physical, chart, labs and discussed the procedure including the risks, benefits and alternatives for the proposed anesthesia with the patient or authorized representative who has indicated his/her understanding and acceptance.   Dental advisory given  Plan Discussed with: Anesthesiologist  Anesthesia Plan Comments:         Anesthesia Quick Evaluation

## 2018-12-04 NOTE — Anesthesia Pain Management Evaluation Note (Signed)
  CRNA Pain Management Visit Note  Patient: Renee Glenn, 22 y.o., female  "Hello I am a member of the anesthesia team at [facility]. We have an anesthesia team available at all times to provide care throughout the hospital, including epidural management and anesthesia for C-section. I don't know your plan for the delivery whether it a natural birth, water birth, IV sedation, nitrous supplementation, doula or epidural, but we want to meet your pain goals."   1.Was your pain managed to your expectations on prior hospitalizations?   No prior hospitalizations  2.What is your expectation for pain management during this hospitalization?     Labor support without medications  3.How can we help you reach that goal? Natural, possibly epidural, all discussed  Record the patient's initial score and the patient's pain goal.   Pain: 10  Pain Goal: 10 [facility] wants you to be able to say your pain was always managed very well.  Renee Glenn, Renee Glenn 12/04/2018

## 2018-12-04 NOTE — Progress Notes (Signed)
L&D Note  Patient feeling more pain and pressure  Patient Vitals for the past 6 hrs:  BP Temp Temp src Pulse Resp  12/04/18 2130 129/71 - - (!) 101 18  12/04/18 2030 138/84 (!) 100.5 F (38.1 C) Oral 97 18  12/04/18 1930 (!) 142/77 - - (!) 115 18  12/04/18 1900 (!) 142/81 - - (!) 101 -  12/04/18 1835 - 100 F (37.8 C) Axillary - 18  12/04/18 1831 134/77 - - (!) 107 -  12/04/18 1800 134/77 - - (!) 105 -  12/04/18 1731 131/78 - - (!) 118 -  12/04/18 1720 - (!) 102 F (38.9 C) Axillary - -  12/04/18 1701 133/71 - - (!) 101 -  12/04/18 1631 134/76 - - 92 -   Mild to moderate distress with UCs Anterior lip, +2, ?ROA-->pt pushed easily past lip and to +3  A/p: pt doing well Continue with abx and pitocin. Set up room for delivery. Epidural working well. D/w pt re: possible operative delivery to expedite delivery and she is amenable to this. Pelvis feels adequate, EFW 3500gm,   Cornelia Copaharlie Aquarius Tremper, Jr MD Attending Center for [facility]) 12/04/2018 Time: 2220

## 2018-12-04 NOTE — Discharge Summary (Addendum)
Postpartum Discharge Summary     Patient Name: Renee Glenn DOB: Aug 29, 1996 MRN: [id number]  Date of admission: 12/03/2018 Delivering Provider: Geanie Berlin A   Date of discharge: 12/06/2018  Admitting diagnosis: 39 WKS, CTX Intrauterine pregnancy: [redacted]w[redacted]d    Secondary diagnosis:  Active Problems:   Gestational hypertension   Premature rupture of membranes   Postpartum care following vaginal delivery  Additional problems:  Intrapartum Fever      Discharge diagnosis: Term Pregnancy Delivered and Chorioamnionitis                                                                                                 Post partum procedures: MMR recommended prior to d/c  Augmentation: Pitocin, Cytotec and Foley Balloon  Complications: RJJO>84hours  Hospital course:  Induction of Labor With Vaginal Delivery   22y.o. yo G1P0 at 354w6das admitted to the hospital 12/03/2018 for induction of labor.  Indication for induction: PROM.  Patient had an uncomplicated labor course as follows: Membrane Rupture Time/Date: 6:00 PM ,12/01/2018   Intrapartum Procedures: Episiotomy: None [1]                                         Lacerations:  None [1]  Patient had delivery of a Viable infant. Infant with poor tone at delivery, clamped immediately and taken to warmer where she had spontaneous cry after suction and stimulation.  Information for the patient's newborn:  WrOleva, Koo0[[id number]]Delivery Method: Vaginal, Spontaneous(Filed from Delivery Summary)   12/04/2018  Details of delivery can be found in separate delivery note.  Patient had a routine postpartum course with the addition of requiring an antihypertensive on PPD#1. Patient is discharged home 12/06/18 and will receive a BP check in 1 week.  Magnesium Sulfate recieved: No BMZ received: No  Physical exam  Vitals:   12/05/18 0711 12/05/18 1130 12/05/18 1550 12/06/18 0503  BP: 139/85 131/75 132/82 135/80  Pulse: (!) 56  65 62 (!) 59  Resp: _0 Temp: (!) 97.4 F (36.3 C) 97.8 F (36.6 C) 98.1 F (36.7 C) 97.6 F (36.4 C)  TempSrc: Oral Axillary Oral Oral  SpO2:   100% 100%  Weight:      Height:       General: alert, cooperative and no distress Lochia: appropriate Uterine Fundus: firm Incision: N/A DVT Evaluation: No evidence of DVT seen on physical exam. Negative Homan's sign. No cords or calf tenderness. No significant calf/ankle edema. Labs: Lab Results  Component Value Date   WBC 10.4 12/03/2018   HGB 11.3 (L) 12/03/2018   HCT 35.2 (L) 12/03/2018   MCV 82.2 12/03/2018   PLT 239 12/03/2018   CMP Latest Ref Rng & Units 12/03/2018  Glucose 70 - 99 mg/dL 77  BUN 6 - 20 mg/dL 8  Creatinine 0.44 - 1.00 mg/dL 0.71  Sodium 135 - 145 mmol/L 133(L)  Potassium 3.5 - 5.1 mmol/L 4.3  Chloride 98 - 111 mmol/L  104  CO2 22 - 32 mmol/L 22  Calcium 8.9 - 10.3 mg/dL 9.0  Total Protein 6.5 - 8.1 g/dL 6.6  Total Bilirubin 0.3 - 1.2 mg/dL 0.5  Alkaline Phos 38 - 126 U/L 77  AST 15 - 41 U/L 19  ALT 0 - 44 U/L 14    Discharge instruction: per After Visit Summary and "Baby and Me Booklet".  After visit meds:  Allergies as of 12/06/2018      Reactions   Penicillins Rash   Has patient had a PCN reaction causing immediate rash, facial/tongue/throat swelling, SOB or lightheadedness with hypotension: no Has patient had a PCN reaction causing severe rash involving mucus membranes or skin necrosis: no Has patient had a PCN reaction that required hospitalization: no Has patient had a PCN reaction occurring within the last 10 years: yes If all of the above answers are "NO", then may proceed with Cephalosporin use.      Medication List    TAKE these medications   amLODipine 5 MG tablet Commonly known as:  NORVASC Take 1 tablet (5 mg total) by mouth daily.   ibuprofen 600 MG tablet Commonly known as:  ADVIL,MOTRIN Take 1 tablet (600 mg total) by mouth every 6 (six) hours.   prenatal  multivitamin Tabs tablet Take 1 tablet by mouth daily at 12 noon.   senna-docusate 8.6-50 MG tablet Commonly known as:  Senokot-S Take 2 tablets by mouth daily for 7 days. Start taking on:  December 07, 2018       Diet: routine diet  Activity: Advance as tolerated. Pelvic rest for 6 weeks.   Outpatient follow up:1wk for BP check, then 4wk PP visit Follow up Appt:No future appointments. Follow up Visit:   Please schedule this patient for Postpartum visit in: 4 weeks with the following provider: Any provider  1 week blood pressure recheck with Baby Love nursing visit. For C/S patients schedule nurse incision check in weeks 2 weeks: no Low risk pregnancy complicated by: None Delivery mode:  SVD Anticipated Birth Control:  IUD PP Procedures needed: BP check  Schedule Integrated [place] visit: no   Newborn Data: Live born female  Birth Weight:   APGAR: 70, 8  Newborn Delivery   Birth date/time:  12/04/2018 22:48:00 Delivery type:  Vaginal, Spontaneous     Baby Feeding: Breast Disposition:home with mother   12/06/2018 Adline Potter, MD  CNM attestation I have seen and examined this patient and agree with above documentation in the resident's note.   Renee Glenn is a 22 y.o. G1P1001 s/p SVD.   Pain is well controlled.  Plan for birth control is IUD.  Method of Feeding: breast  PE:  BP 135/80 (BP Location: Left Arm)   Pulse (!) 59   Temp 97.6 F (36.4 C) (Oral)   Resp 18   Ht _0  (1.676 m)   Wt 94.8 kg   LMP 02/28/2018 (Exact Date)   SpO2 100%   Breastfeeding Unknown   BMI 33.73 kg/m  Fundus firm  Recent Labs    12/03/18 1644  HGB 11.3*  HCT 35.2*     Plan: discharge today - postpartum care discussed - f/u clinic in 1wk for BP check, then 4 weeks for postpartum visit   Myrtis Ser, CNM 9:30 AM  12/06/2018

## 2018-12-04 NOTE — Consult Note (Signed)
Neonatology Note:   Attendance at Delivery:    I was asked by Dr. Vergie LivingPickens to attend this NSVD at term due to chorioamnionitis. The mother is a G1P0 O pos, GBS neg with obesity and history of smoking marijuana. She had leakage of a small amount of clear fluid 12/9 at 1800 hrs and large gush 12/11 at 1045. ROM possibly as long as 77 hours, but at least 36 hours before delivery, fluid clear. Maternal fever to 102 degrees today, treated with Clindamycin and Gentamicin. There was fetal tachycardia prior to delivery. Infant floppy and blue, with some respiratory effort, but no cry. HR > 100. We did bulb suctioning and gave stimulation, to which she responded with crying, weak at first, then getting more vigorous over time. Tone also improved slowly and was normal by 10 minutes. We followed O2 saturations by pulse oximetry and they were normal throughout. Ap 5/8/9. Lungs clear to ausc in DR. Infant is able to remain with her mother for skin to skin time under nursing supervision. Transferred to the care of Pediatrician.   Doretha Souhristie C. Emanual Lamountain, MD

## 2018-12-04 NOTE — Progress Notes (Addendum)
LABOR PROGRESS NOTE  Renee Glenn is a 22 y.o. G1P0 at 6035w5d  admitted for management of SROM without SOL.  Subjective: Patient having late decels, fluid bolus and amnioinfusion ordered.  Reports sensation of pressure and involuntary pushing. Nurse concern for cervical swelling creating a reduced check since prior recorded.   Objective: BP (!) 142/77   Pulse (!) 115   Temp 100 F (37.8 C) (Axillary)   Resp 18   Ht 5\' 6"  (1.676 m)   Wt 94.8 kg   LMP 02/28/2018 (Exact Date)   SpO2 99%   BMI 33.73 kg/m  or  Vitals:   12/04/18 1831 12/04/18 1835 12/04/18 1900 12/04/18 1930  BP: 134/77  (!) 142/81 (!) 142/77  Pulse: (!) 107  (!) 101 (!) 115  Resp:  18  18  Temp:  100 F (37.8 C)    TempSrc:  Axillary    SpO2:      Weight:      Height:        Dilation: 8.5 Effacement (%): 80 Cervical Position: Posterior Station: 0 Presentation: Vertex Exam by:: Hermenia BersMichelle Kelly, RN FHT: baseline rate 145, moderate varibility, no acels, late decels Toco: None present  Labs: Lab Results  Component Value Date   WBC 10.4 12/03/2018   HGB 11.3 (L) 12/03/2018   HCT 35.2 (L) 12/03/2018   MCV 82.2 12/03/2018   PLT 239 12/03/2018    Patient Active Problem List   Diagnosis Date Noted  . Indication for care in labor or delivery 12/03/2018  . Traumatic injury during pregnancy in third trimester 11/17/2018    Assessment / Plan: Renee Glenn is a 22 y.o. G1P0 at 4635w5d  admitted for management of SROM without SOL.  Labor: No progression since 1700 with questionable regression on cervical exam.  Fetal Wellbeing:  Late decels. Amnioinfusion running Pain Control:  Epidural in place Anticipated MOD:  Vaginal vs CS for FTP with fetal intolerance of labor and chorio pending recheck 2200  Wendie AgresteSarah Asman Jonnatan Hanners, MD PGY-1 Family Medicine Resident 12/04/2018, 9:01 PM

## 2018-12-05 ENCOUNTER — Encounter (HOSPITAL_COMMUNITY): Payer: Self-pay

## 2018-12-05 MED ORDER — DIBUCAINE 1 % RE OINT: 1.0000 "application " | TOPICAL_OINTMENT | RECTAL | Status: DC | PRN

## 2018-12-05 MED ORDER — ACETAMINOPHEN 325 MG PO TABS: 650.0000 mg | ORAL_TABLET | ORAL | Status: DC | PRN

## 2018-12-05 MED ORDER — ZOLPIDEM TARTRATE 5 MG PO TABS: 5.0000 mg | ORAL_TABLET | Freq: Every evening | ORAL | Status: DC | PRN

## 2018-12-05 MED ORDER — DIPHENHYDRAMINE HCL 25 MG PO CAPS: 25.0000 mg | ORAL_CAPSULE | Freq: Four times a day (QID) | ORAL | Status: DC | PRN

## 2018-12-05 MED ORDER — SIMETHICONE 80 MG PO CHEW: 80.0000 mg | CHEWABLE_TABLET | ORAL | Status: DC | PRN

## 2018-12-05 MED ORDER — OXYCODONE HCL 5 MG PO TABS: 10.0000 mg | ORAL_TABLET | ORAL | Status: DC | PRN

## 2018-12-05 MED ORDER — ONDANSETRON HCL 4 MG PO TABS: 4.0000 mg | ORAL_TABLET | ORAL | Status: DC | PRN

## 2018-12-05 MED ORDER — OXYCODONE HCL 5 MG PO TABS: 5.0000 mg | ORAL_TABLET | ORAL | Status: DC | PRN

## 2018-12-05 MED ORDER — TRANEXAMIC ACID-NACL 1000-0.7 MG/100ML-% IV SOLN
1000.0000 mg | INTRAVENOUS | Status: AC
  Administered 2018-12-05: 1000 mg via INTRAVENOUS
  Filled 2018-12-05: qty 100

## 2018-12-05 MED ORDER — PRENATAL MULTIVITAMIN CH
1.0000 | ORAL_TABLET | Freq: Every day | ORAL | Status: DC
  Administered 2018-12-05 – 2018-12-06 (×2): 1 via ORAL

## 2018-12-05 MED ORDER — WITCH HAZEL-GLYCERIN EX PADS: 1.0000 "application " | MEDICATED_PAD | CUTANEOUS | Status: DC | PRN

## 2018-12-05 MED ORDER — SENNOSIDES-DOCUSATE SODIUM 8.6-50 MG PO TABS
2.0000 | ORAL_TABLET | ORAL | Status: DC
  Administered 2018-12-05 (×2): 2 via ORAL
  Filled 2018-12-05 (×2): qty 2

## 2018-12-05 MED ORDER — METHYLERGONOVINE MALEATE 0.2 MG/ML IJ SOLN
0.2000 mg | Freq: Once | INTRAMUSCULAR | Status: AC
  Administered 2018-12-05: 0.2 mg via INTRAMUSCULAR
  Filled 2018-12-05: qty 1

## 2018-12-05 MED ORDER — ONDANSETRON HCL 4 MG/2ML IJ SOLN: 4.0000 mg | INTRAMUSCULAR | Status: DC | PRN

## 2018-12-05 MED ORDER — PRENATAL MULTIVITAMIN CH
1.0000 | ORAL_TABLET | Freq: Every day | ORAL | Status: DC
  Filled 2018-12-05 (×2): qty 1

## 2018-12-05 MED ORDER — COCONUT OIL OIL
1.0000 "application " | TOPICAL_OIL | Status: DC | PRN
  Administered 2018-12-06: 1 via TOPICAL
  Filled 2018-12-05: qty 120

## 2018-12-05 MED ORDER — OXYTOCIN 40 UNITS IN LACTATED RINGERS INFUSION - SIMPLE MED: 2.5000 [IU]/h | INTRAVENOUS | Status: DC | PRN

## 2018-12-05 MED ORDER — BENZOCAINE-MENTHOL 20-0.5 % EX AERO: 1.0000 "application " | INHALATION_SPRAY | CUTANEOUS | Status: DC | PRN

## 2018-12-05 MED ORDER — IBUPROFEN 600 MG PO TABS
600.0000 mg | ORAL_TABLET | Freq: Four times a day (QID) | ORAL | Status: DC
  Administered 2018-12-05 – 2018-12-06 (×8): 600 mg via ORAL
  Filled 2018-12-05 (×6): qty 1

## 2018-12-05 MED ORDER — AMLODIPINE BESYLATE 5 MG PO TABS
5.0000 mg | ORAL_TABLET | Freq: Every day | ORAL | Status: DC
  Administered 2018-12-05 – 2018-12-06 (×2): 5 mg via ORAL
  Filled 2018-12-05 (×3): qty 1

## 2018-12-05 NOTE — Progress Notes (Addendum)
Post Partum Day 1 Subjective: no complaints, up ad lib, voiding and tolerating PO, not passing gas yet. She feels well and afebrile. She will be taking a shower today.  Objective: Blood pressure 139/85, pulse (!) 56, temperature (!) 97.4 F (36.3 C), temperature source Oral, resp. rate 18, height 5' 6"  (1.676 m), weight 94.8 kg, last menstrual period 02/28/2018, SpO2 99 %, unknown if currently breastfeeding.  Physical Exam:  General: alert, cooperative, appears stated age and no distress Lochia: appropriate Uterine Fundus: firm Incision: none DVT Evaluation: No evidence of DVT seen on physical exam.  Recent Labs    12/03/18 1644  HGB 11.3*  HCT 35.2*    Assessment/Plan: Plan for discharge tomorrow  Continue to monitor temperatures and fevers Initiate norvasc   LOS: 2 days   Chelsey L Anderson 12/05/2018, 7:52 AM   I personally saw and evaluated the patient, performing the key elements of the service. I developed and verified the management plan that is described in the resident's/student's note, and I agree with the content with my edits above. VSS, HRR&R, Resp unlabored, Legs neg.  Nigel Berthold, CNM 12/05/2018 8:10 AM

## 2018-12-05 NOTE — Clinical Social Work Maternal (Signed)
CLINICAL SOCIAL WORK MATERNAL/CHILD NOTE  Patient Details  Name: Renee Glenn MRN: [id number] Date of Birth: Jan 02, 1996  Date:  12/05/2018  Clinical Social Worker Initiating Note:  Celso Sickle, [place] Date/Time: Initiated:  12/05/18/1305     Child's Name:  Renee Glenn   Biological Parents:  Mother, Father(Father - Roderic Scarce)   Need for Interpreter:  None   Reason for Referral:  Current Substance Use/Substance Use During Pregnancy    Address:  [address] [place] [address] (Mailing Address)  (Physical Address) [address] [place], [place] [address]   Phone number:  [phone] (home)     Additional phone number:   Household Members/Support Persons (HM/SP):   Household Member/Support Person 1   HM/SP Name Relationship DOB or Age  HM/SP -1 Colyn Wooten FOB 1996/11/09  HM/SP -2        HM/SP -3        HM/SP -4        HM/SP -5        HM/SP -6        HM/SP -7        HM/SP -8          Natural Supports (not living in the home):  Parent, Friends   Herbalist: None   Employment: Environmental education officer   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)   Education:  Some Materials engineer arranged:    Surveyor, quantity Resources:  Medicaid   Other Resources:  [facility]   Cultural/Religious Considerations Which May Impact Care:    Strengths:  Ability to meet basic needs , Home prepared for child , Pediatrician chosen   Psychotropic Medications:         Pediatrician:    Armed forces operational officer area  Pediatrician List:   [facility]  [employer]    [facility]      Pediatrician Fax Number:    Risk Factors/Current Problems:  Substance Use    Cognitive State:  Able to Concentrate , Alert , Linear Thinking , Goal Oriented    Mood/Affect:  Calm , Interested , Happy    CSW Assessment: CSW spoke with MOB at bedside, MOB had several visitors and she asked them to  step out. CSW introduced self and explained reason for consult. MOB was welcoming and engaged throughout assessment. MOB was engaging in skin to skin with infant and appeared to be attached and bonded with infant. 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 MOB reported that she has everything that she needs for the baby. CSW inquired about MOB's mental health history, MOB denied any mental health history. CSW inquired about MOB's support system. MOB reported that her supports include her mother, best friend, grandma and FOB's mother. MOB presented calm and did not demonstrate any acute mental health signs/symptoms. CSW assessed for safety, MOB denied SI, HI and domestic violence. FOB entered the room, MOB gave CSW permission to continue assessment with FOB present.   CSW provided education regarding the baby blues period vs. perinatal mood disorders, discussed treatment and gave resources for mental health follow up if concerns arise.  CSW recommends self-evaluation during the postpartum time period using the New Mom Checklist from Postpartum Progress and encouraged MOB to contact a medical professional if symptoms are noted at any time.   CSW provided review of Sudden Infant Death Syndrome (SIDS) precautions.  CSW and MOB discussed MOB's substance use and hospital drug policy. MOB reported that she smoked marijuana during pregnancy and her last use was last month. CSW informed MOB about hospital drug policy, MOB verbalized understanding and reported that she did not have any questions.   CSW identifies no further need for intervention and no barriers to discharge at this time.  CSW Plan/Description:  No Further Intervention Required/No Barriers to Discharge, Sudden Infant Death Syndrome (SIDS) Education, Perinatal Mood and Anxiety Disorder (PMADs) Education, Hospital Drug Screen Policy Information, CSW Will Continue to Monitor Umbilical Cord Tissue Drug Screen Results and Make Report if  Jamelle RushingWarranted    Michele Judy L Kendyl Festa, LCSW 12/05/2018, 1:10 PM

## 2018-12-05 NOTE — Progress Notes (Signed)
CSW acknowledged consult and attempted to see patient to complete assessment. MOB had several visitors and WIC was currently seeing patient. CSW agreed to come back at a later time.  CSW will attempt to see patient at a later time.  Aine Strycharz, LCSWA Clinical Social Worker [facility] Cell#: [phone] 

## 2018-12-05 NOTE — Lactation Note (Addendum)
This note was copied from a baby's chart. Lactation Consultation Note  Patient Name: Renee Orpah ClintonShaitiyah [name]: 12/05/2018 Reason for consult: Initial assessment   P1, 11 hours old.  Baby sleeping. Mother states she took breastfeeding classes.  Mother has started giving formula due to difficulty latching. Discussed supply and demand. Mom has my # to call for assist w/next feeding. Mom made aware of O/P services, breastfeeding support groups, community resources, and our phone # for post-discharge questions.   Mother called for assistance with latching.  Reviewed hand expression w/ drops expressed. Mother's R breast is larger than L breast.  Nipples evert and compressible.  Had mother prepump before latching. Baby latched for approx 8 min on the R side in cross cradle hold. When baby unlatched, assisted w/ latching baby on L side in football hold. Mother stated she likes football hold better.  Intermittent sucks and swallows observed. Discussed basics.  Mother is working on maintaining depth.   Baby comes off and on breast.  Mother denies soreness.  Feed on demand with feeding cues approximately 8-12 times per day.       Maternal Data    Feeding Feeding Type: Bottle Fed - Formula Nipple Type: Slow - flow  LATCH Score                   Interventions Interventions: Breast feeding basics reviewed  Lactation Tools Discussed/Used     Consult Status Consult Status: Follow-up Date: 12/05/18 Follow-up type: In-patient    Dahlia ByesBerkelhammer, Maanav Kassabian [facility] 12/05/2018, 10:22 AM

## 2018-12-05 NOTE — Anesthesia Postprocedure Evaluation (Signed)
Anesthesia Post Note  Patient: Renee Glenn  Procedure(s) Performed: AN AD HOC LABOR EPIDURAL     Patient location during evaluation: Mother Baby Anesthesia Type: Epidural Level of consciousness: awake and alert and oriented Pain management: satisfactory to patient Vital Signs Assessment: post-procedure vital signs reviewed and stable Respiratory status: respiratory function stable Cardiovascular status: stable Postop Assessment: no headache, no backache, epidural receding, patient able to bend at knees, no signs of nausea or vomiting and adequate PO intake Anesthetic complications: no    Last Vitals:  Vitals:   12/05/18 0240 12/05/18 0711  BP: (!) 143/72 139/85  Pulse: 72 (!) 56  Resp: 18 18  Temp: 36.8 C (!) 36.3 C  SpO2:      Last Pain:  Vitals:   12/05/18 0711  TempSrc: Oral  PainSc: 0-No pain   Pain Goal:                 Doneen Ollinger

## 2018-12-06 ENCOUNTER — Encounter (HOSPITAL_COMMUNITY): Payer: Self-pay | Admitting: *Deleted

## 2018-12-06 ENCOUNTER — Other Ambulatory Visit: Payer: Self-pay

## 2018-12-06 MED ORDER — AMLODIPINE BESYLATE 5 MG PO TABS: 5.0000 mg | ORAL_TABLET | Freq: Every day | ORAL | 2 refills | Status: AC

## 2018-12-06 MED ORDER — MEASLES, MUMPS & RUBELLA VAC IJ SOLR
0.5000 mL | Freq: Once | INTRAMUSCULAR | Status: AC
  Administered 2018-12-06: 0.5 mL via SUBCUTANEOUS
  Filled 2018-12-06: qty 0.5

## 2018-12-06 MED ORDER — AMLODIPINE BESYLATE 5 MG PO TABS
5.0000 mg | ORAL_TABLET | Freq: Once | ORAL | Status: DC
  Filled 2018-12-06: qty 1

## 2018-12-06 MED ORDER — IBUPROFEN 600 MG PO TABS: 600.0000 mg | ORAL_TABLET | Freq: Four times a day (QID) | ORAL | 0 refills | Status: AC

## 2018-12-06 MED ORDER — SENNOSIDES-DOCUSATE SODIUM 8.6-50 MG PO TABS: 2.0000 | ORAL_TABLET | ORAL | 0 refills | Status: AC

## 2018-12-06 NOTE — Lactation Note (Signed)
This note was copied from a baby's chart. Lactation Consultation Note  Patient Name: Renee Glenn XBJYN'WToday's Date: 12/06/2018 Reason for consult: Follow-up assessment;Term;Infant weight loss  Baby is 37 hours old  LC reviewed and updated the doc flow sheets per mom and the [facility] consult. Per mom baby last fed at 1000 for 10 mins.   Baby awake and hungry , LC checked diaper dry.  LC offered to assist  Mom and check latch, mom receptive/ right breast / football/  Latched easily and worked on obtaining depth. Multiple swallows and showed  Mom how to intermittently compress breast / increased swallows noted.  Baby fed for 10 mins and released. Baby still hungry / LC showed mom the cross cradle  On the left and latched easily with depth / increased swallows noted and mom knew to massage breast intermittently/ per mom comfortable. Still feeding .  LC noted while assisting mom with latch her right breast is well developed and her right breast is significantly smaller and appears under developed. Per mom had breast changes 1st trimester ,  Right increased in size / left slightly , increased soreness nipples and breast / darkened areolas.  Able to express milk prior to latch and afterwards.  LC recommended so baby receives more from the breast - prior to latch - breast massage , hand express, pre- pump to prime the milk ducts, and latch the baby with compressions and firm support.  LC recommended to offer the 2nd breast if baby is still hungry, if satisfied hold off on the supplement , if not keep it at 15 -20 ml / PACE feeding.  LC instructed mom on the use of shells and reviewed hand pump.         Maternal Data Has patient been taught Hand Expression?: Yes Does the patient have breastfeeding experience prior to this delivery?: No  Feeding Feeding Type: Breast Fed  LATCH Score Latch: Grasps breast easily, tongue down, lips flanged, rhythmical sucking.  Audible Swallowing: Spontaneous  and intermittent  Type of Nipple: Everted at rest and after stimulation  Comfort (Breast/Nipple): Soft / non-tender  Hold (Positioning): Assistance needed to correctly position infant at breast and maintain latch.  LATCH Score: 9  Interventions Interventions: Breast feeding basics reviewed;Assisted with latch;Skin to skin;Breast compression;Adjust position;Support pillows;Position options;Shells;Hand pump  Lactation Tools Discussed/Used Tools: Shells;Pump Shell Type: Inverted Breast pump type: Manual WIC Program: Yes Pump Review: Setup, frequency, and cleaning;Milk Storage Initiated by:: MAI  Date initiated:: 12/06/18   Consult Status Consult Status: Follow-up Date: 12/07/18 Follow-up type: In-patient    Matilde SprangMargaret Ann Junia Nygren 12/06/2018, 11:58 AM

## 2018-12-06 NOTE — Discharge Instructions (Signed)
Postpartum Hypertension °Postpartum hypertension is high blood pressure after pregnancy that remains higher than normal for more than two days after delivery. You may not realize that you have postpartum hypertension if your blood pressure is not being checked regularly. In some cases, postpartum hypertension will go away on its own, usually within a week of delivery. However, for some women, medical treatment is required to prevent serious complications, such as seizures or stroke. °The following things can affect your blood pressure: °· The type of delivery you had. °· Having received IV fluids or other medicines during or after delivery. ° °What are the causes? °Postpartum hypertension may be caused by any of the following or by a combination of any of the following: °· Hypertension that existed before pregnancy (chronic hypertension). °· Gestational hypertension. °· Preeclampsia or eclampsia. °· Receiving a lot of fluid through an IV during or after delivery. °· Medicines. °· HELLP syndrome. °· Hyperthyroidism. °· Stroke. °· Other rare neurological or blood disorders. ° °In some cases, the cause may not be known. °What increases the risk? °Postpartum hypertension can be related to one or more risk factors, such as: °· Chronic hypertension. In some cases, this may not have been diagnosed before pregnancy. °· Obesity. °· Type 2 diabetes. °· Kidney disease. °· Family history of preeclampsia. °· Other medical conditions that cause hormonal imbalances. ° °What are the signs or symptoms? °As with all types of hypertension, postpartum hypertension may not have any symptoms. Depending on how high your blood pressure is, you may experience: °· Headaches. These may be mild, moderate, or severe. They may also be steady, constant, or sudden in onset (thunderclap headache). °· Visual changes. °· Dizziness. °· Shortness of breath. °· Swelling of your hands, feet, lower legs, or face. In some cases, you may have swelling in  more than one of these locations. °· Heart palpitations or a racing heartbeat. °· Difficulty breathing while lying down. °· Decreased urination. ° °Other rare signs and symptoms may include: °· Sweating more than usual. This lasts longer than a few days after delivery. °· Chest pain. °· Sudden dizziness when you get up from sitting or lying down. °· Seizures. °· Nausea or vomiting. °· Abdominal pain. ° °How is this diagnosed? °The diagnosis of postpartum hypertension is made through a combination of physical examination findings and testing of your blood and urine. You may also have additional tests, such as a CT scan or an MRI, to check for other complications of postpartum hypertension. °How is this treated? °When blood pressure is high enough to require treatment, your options may include: °· Medicines to reduce blood pressure (antihypertensives). Tell your health care provider if you are breastfeeding or if you plan to breastfeed. There are many antihypertensive medicines that are safe to take while breastfeeding. °· Stopping medicines that may be causing hypertension. °· Treating medical conditions that are causing hypertension. °· Treating the complications of hypertension, such as seizures, stroke, or kidney problems. ° °Your health care provider will also continue to monitor your blood pressure closely and repeatedly until it is within a safe range for you. °Follow these instructions at home: °· Take medicines only as directed by your health care provider. °· Get regular exercise after your health care provider tells you that it is safe. °· Follow your health care provider’s recommendations on fluid and salt restrictions. °· Do not use any tobacco products, including cigarettes, chewing tobacco, or electronic cigarettes. If you need help quitting, ask your health care provider. °·   Keep all follow-up visits as directed by your health care provider. This is important. °Contact a health care provider  if: °· Your symptoms get worse. °· You have new symptoms, such as: °? Headache. °? Dizziness. °? Visual changes. °Get help right away if: °· You develop a severe or sudden headache. °· You have seizures. °· You develop numbness or weakness on one side of your body. °· You have difficulty thinking, speaking, or swallowing. °· You develop severe abdominal pain. °· You develop difficulty breathing, chest pain, a racing heartbeat, or heart palpitations. °These symptoms may represent a serious problem that is an emergency. Do not wait to see if the symptoms will go away. Get medical help right away. Call your local emergency services (911 in the [place] Do not drive yourself to the hospital. °This information is not intended to replace advice given to you by your health care provider. Make sure you discuss any questions you have with your health care provider. °Document Released: 08/13/2014 Document Revised: 05/14/2016 Document Reviewed: 06/24/2014 °Elsevier Interactive Patient Education © 2018 [employer] °Vaginal Delivery, Care After °Refer to this sheet in the next few weeks. These instructions provide you with information about caring for yourself after vaginal delivery. Your health care provider may also give you more specific instructions. Your treatment has been planned according to current medical practices, but problems sometimes occur. Call your health care provider if you have any problems or questions. °What can I expect after the procedure? °After vaginal delivery, it is common to have: °· Some bleeding from your vagina. °· Soreness in your abdomen, your vagina, and the area of skin between your vaginal opening and your anus (perineum). °· Pelvic cramps. °· Fatigue. ° °Follow these instructions at home: °Medicines °· Take over-the-counter and prescription medicines only as told by your health care provider. °· If you were prescribed an antibiotic medicine, take it as told by your health care provider. Do  not stop taking the antibiotic until it is finished. °Driving ° °· Do not drive or operate heavy machinery while taking prescription pain medicine. °· Do not drive for 24 hours if you received a sedative. °Lifestyle °· Do not drink alcohol. This is especially important if you are breastfeeding or taking medicine to relieve pain. °· Do not use tobacco products, including cigarettes, chewing tobacco, or e-cigarettes. If you need help quitting, ask your health care provider. °Eating and drinking °· Drink at least 8 eight-ounce glasses of water every day unless you are told not to by your health care provider. If you choose to breastfeed your baby, you may need to drink more water than this. °· Eat high-fiber foods every day. These foods may help prevent or relieve constipation. High-fiber foods include: °? Whole grain cereals and breads. °? Brown rice. °? Beans. °? Fresh fruits and vegetables. °Activity °· Return to your normal activities as told by your health care provider. Ask your health care provider what activities are safe for you. °· Rest as much as possible. Try to rest or take a nap when your baby is sleeping. °· Do not lift anything that is heavier than your baby or 10 lb (4.5 kg) until your health care provider says that it is safe. °· Talk with your health care provider about when you can engage in sexual activity. This may depend on your: °? Risk of infection. °? Rate of healing. °? Comfort and desire to engage in sexual activity. °Vaginal Care °· If you have an episiotomy   or a vaginal tear, check the area every day for signs of infection. Check for: °? More redness, swelling, or pain. °? More fluid or blood. °? Warmth. °? Pus or a bad smell. °· Do not use tampons or douches until your health care provider says this is safe. °· Watch for any blood clots that may pass from your vagina. These may look like clumps of dark red, brown, or black discharge. °General instructions °· Keep your perineum clean and  dry as told by your health care provider. °· Wear loose, comfortable clothing. °· Wipe from front to back when you use the toilet. °· Ask your health care provider if you can shower or take a bath. If you had an episiotomy or a perineal tear during labor and delivery, your health care provider may tell you not to take baths for a certain length of time. °· Wear a bra that supports your breasts and fits you well. °· If possible, have someone help you with household activities and help care for your baby for at least a few days after you leave the hospital. °· Keep all follow-up visits for you and your baby as told by your health care provider. This is important. °Contact a health care provider if: °· You have: °? Vaginal discharge that has a bad smell. °? Difficulty urinating. °? Pain when urinating. °? A sudden increase or decrease in the frequency of your bowel movements. °? More redness, swelling, or pain around your episiotomy or vaginal tear. °? More fluid or blood coming from your episiotomy or vaginal tear. °? Pus or a bad smell coming from your episiotomy or vaginal tear. °? A fever. °? A rash. °? Little or no interest in activities you used to enjoy. °? Questions about caring for yourself or your baby. °· Your episiotomy or vaginal tear feels warm to the touch. °· Your episiotomy or vaginal tear is separating or does not appear to be healing. °· Your breasts are painful, hard, or turn red. °· You feel unusually sad or worried. °· You feel nauseous or you vomit. °· You pass large blood clots from your vagina. If you pass a blood clot from your vagina, save it to show to your health care provider. Do not flush blood clots down the toilet without having your health care provider look at them. °· You urinate more than usual. °· You are dizzy or light-headed. °· You have not breastfed at all and you have not had a menstrual period for 12 weeks after delivery. °· You have stopped breastfeeding and you have not had  a menstrual period for 12 weeks after you stopped breastfeeding. °Get help right away if: °· You have: °? Pain that does not go away or does not get better with medicine. °? Chest pain. °? Difficulty breathing. °? Blurred vision or spots in your vision. °? Thoughts about hurting yourself or your baby. °· You develop pain in your abdomen or in one of your legs. °· You develop a severe headache. °· You faint. °· You bleed from your vagina so much that you fill two sanitary pads in one hour. °This information is not intended to replace advice given to you by your health care provider. Make sure you discuss any questions you have with your health care provider. °Document Released: 12/07/2000 Document Revised: 05/23/2016 Document Reviewed: 12/25/2015 °Elsevier Interactive Patient Education © 2018 Else[employer] Inc[employer] ° °

## 2018-12-07 ENCOUNTER — Ambulatory Visit: Payer: Self-pay

## 2018-12-07 NOTE — Lactation Note (Signed)
This note was copied from a baby's chart. Lactation Consultation Note  Patient Name: Renee Glenn QMVHQ'IToday's Date: 12/07/2018 Reason for consult: Follow-up assessment;1st time breastfeeding;Primapara;Infant weight loss(6% weight loss / LC enc mom to working on the [employer] )  Baby is 7957 hours old ,  LC reviewed and updated the doc flow sheets per mom .  Per  Mom the last time the baby was to the breast was last night around 6:30 p.  LC reviewed supply and demand and the importance of giving baby practice latching  So she will help bring the milk in.  LC reviewed sore nipple and engorgement prevention and tx  Mom has a hand pump ( instructed yesterday and shells) .  Storage of breast milk reviewed.  Mother informed of post-discharge support and given phone number to the lactation department, including services for phone call assistance; out-patient appointments; and breastfeeding support group. List of other breastfeeding resources in the community given in the handout. Encouraged mother to call for problems or concerns related to breastfeeding.   Maternal Data    Feeding Feeding Type: Bottle Fed - Formula(per mom ) Nipple Type: Slow - flow  LATCH Score                   Interventions Interventions: Breast feeding basics reviewed  Lactation Tools Discussed/Used Tools: Pump;Shells Shell Type: Inverted Breast pump type: Manual Pump Review: Milk Storage Initiated by:: MAI  Date initiated:: 12/07/18   Consult Status Consult Status: Complete Date: 12/07/18    Matilde SprangMargaret Ann Cochise Dinneen 12/07/2018, 8:16 AM

## 2020-01-27 IMAGING — US US MFM OB COMP +14 WKS
1 series · 13 of 28 positions shown · non-contrast
Comparison: none

[Series 1: us mfm ob comp +14 wks · 76 acquisitions, 13 frames shown]
[im 3/76]
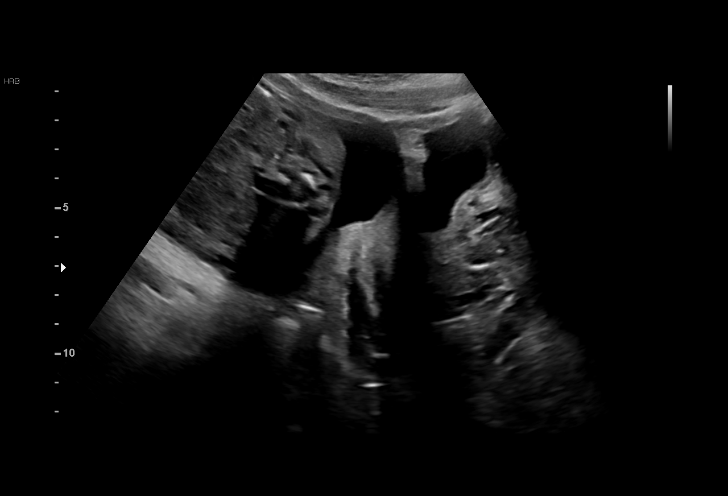
[im 9/76]
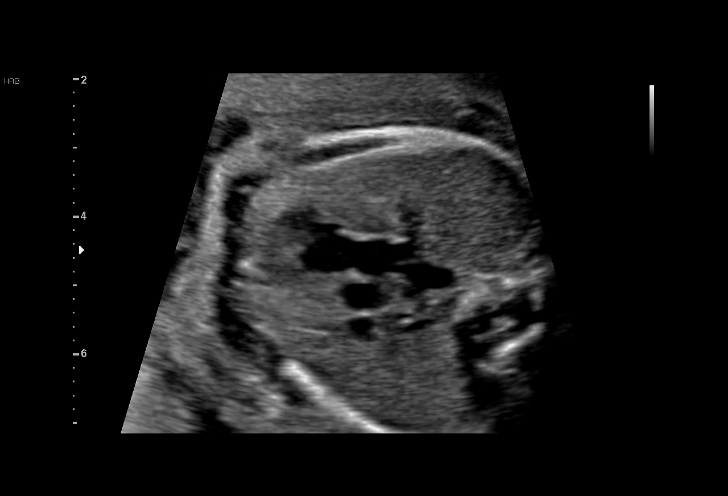
[im 14/76]
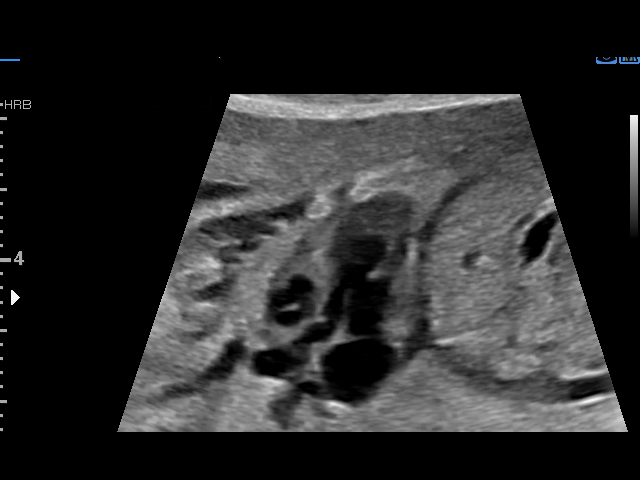
[im 20/76]
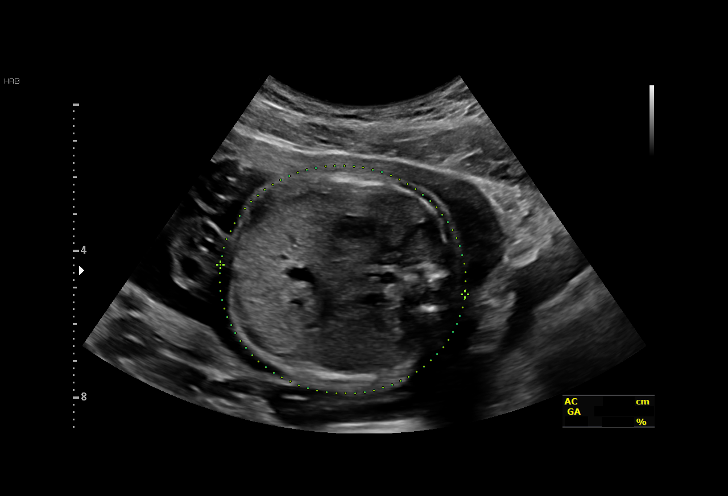
[im 26/76]
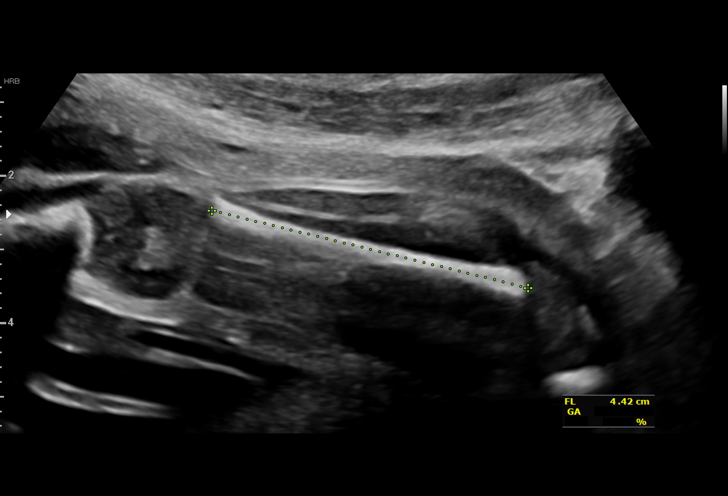
[im 31/76]
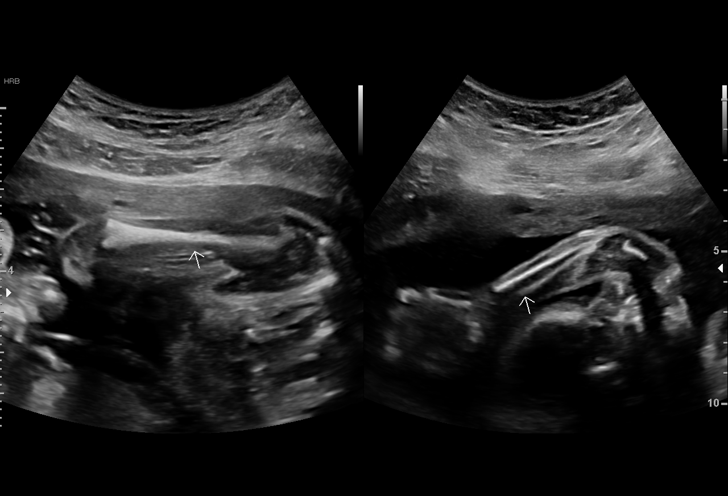
[im 39/76]
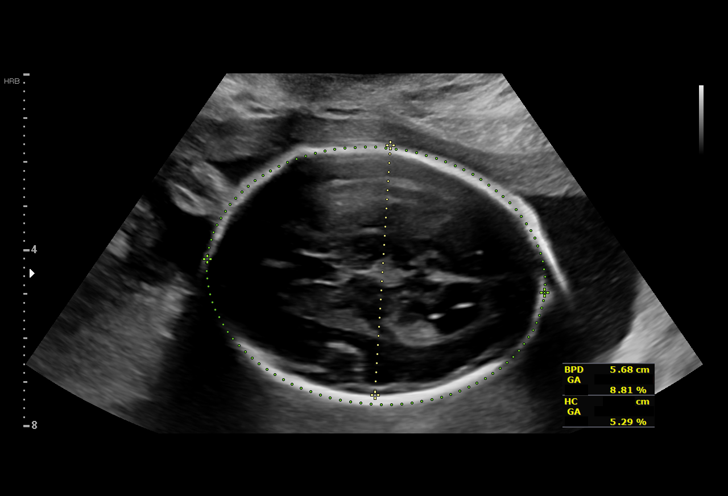
[im 45/76]
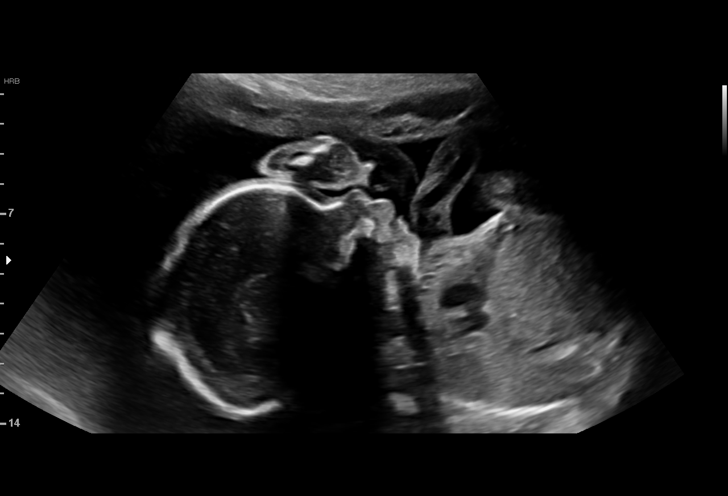
[im 51/76]
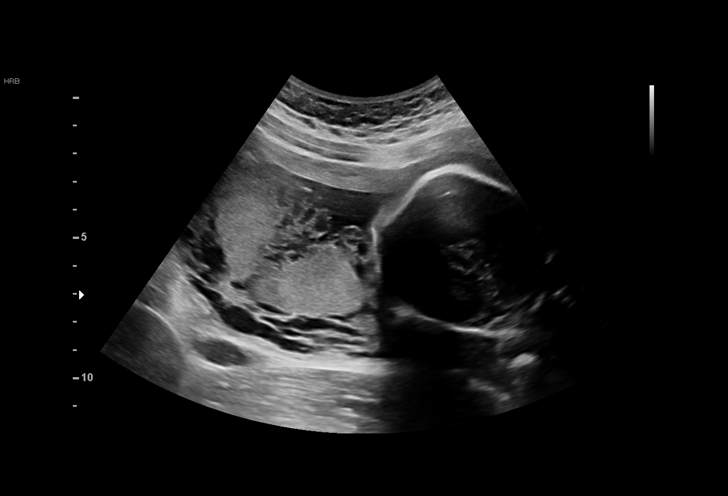
[im 56/76]
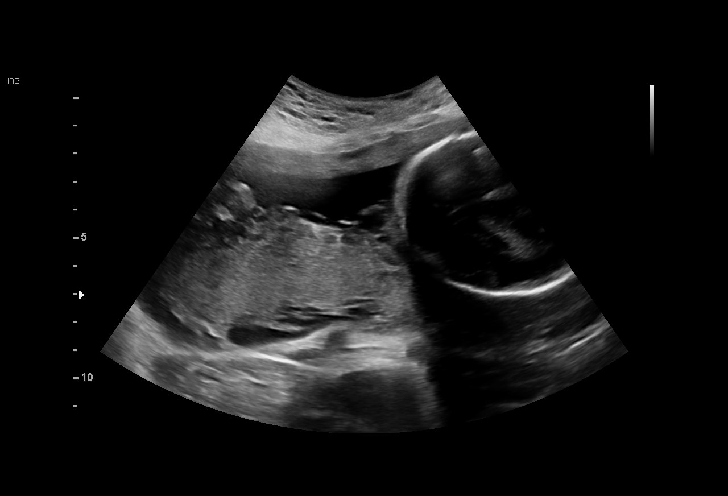
[im 62/76]
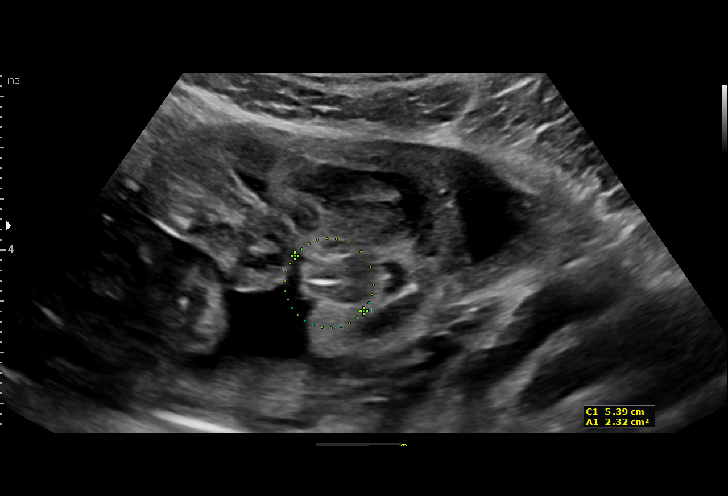
[im 67/76]
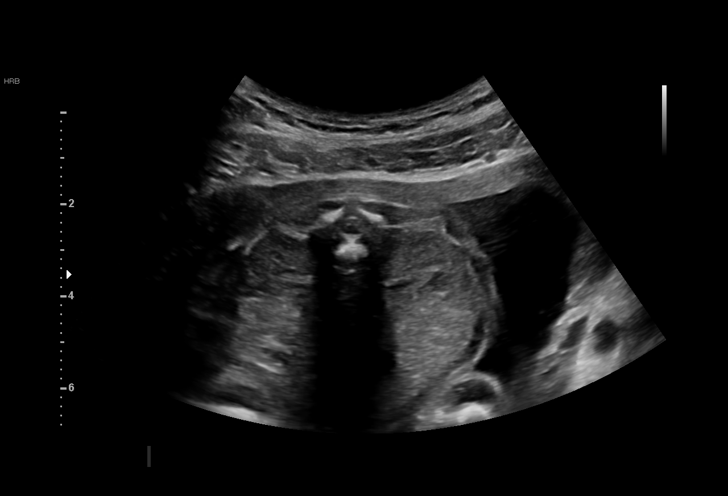
[im 73/76]
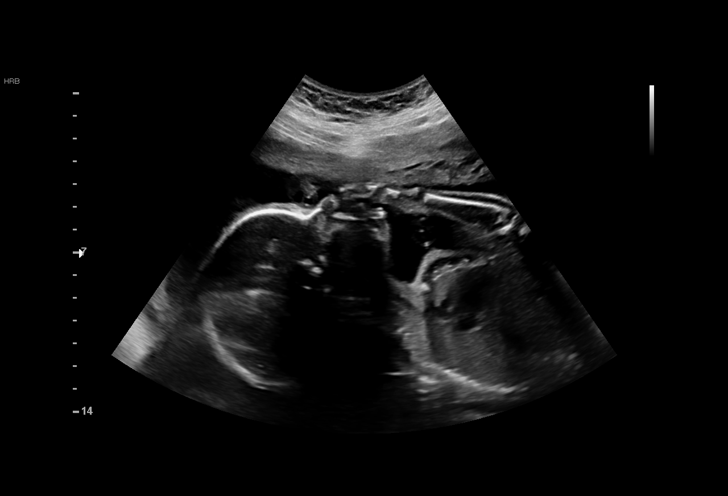

[13 of 28 positions shown; findings below may reference images not displayed]

Health
CONRADO NP

1  US MFM OB COMP + 14 WK               76805.01     ZEINAB TIGER

Indications

24 weeks gestation of pregnancy
Asthma                                         Q77.07 j28.676
Encounter for antenatal screening for
malformations
Size of fetus inconsistent with dates in
second trimester
Vital Signs

Height:        5'6"
Fetal Evaluation

Num Of Fetuses:         1
Fetal Heart Rate(bpm):  153
Cardiac Activity:       Observed
Presentation:           Breech
Placenta:               Posterior
P. Cord Insertion:      Visualized, central

Amniotic Fluid
AFI FV:      Within normal limits

Largest Pocket(cm)
3.56
Biometry

BPD:      56.8  mm     G. Age:  23w 2d          9  %    CI:        71.84   %    70 - 86
FL/HC:      20.6   %    18.7 -
HC:      213.3  mm     G. Age:  23w 3d          5  %    HC/AC:      1.04        1.04 -
AC:       206   mm     G. Age:  25w 1d         60  %    FL/BPD:     77.3   %    71 - 87
FL:       43.9  mm     G. Age:  24w 3d         33  %    FL/AC:      21.3   %    20 - 24
HUM:      43.7  mm     G. Age:  26w 0d         79  %
CER:      27.8  mm     G. Age:  24w 6d         59  %
LV:        4.4  mm
CM:        2.8  mm

Est. FW:     717  gm      1 lb 9 oz     53  %
OB History

Gravidity:    1
Gestational Age

LMP:           24w 4d        Date:  02/28/18                 EDD:   12/05/18
U/S Today:     24w 1d                                        EDD:   12/08/18
Best:          24w 4d     Det. By:  LMP  (02/28/18)          EDD:   12/05/18
Anatomy

Cranium:               Appears normal         Aortic Arch:            Appears normal
Cavum:                 Appears normal         Ductal Arch:            Appears normal
Ventricles:            Appears normal         Diaphragm:              Appears normal
Choroid Plexus:        Appears normal         Stomach:                Appears normal, left
sided
Cerebellum:            Appears normal         Abdomen:                Appears normal
Posterior Fossa:       Appears normal         Abdominal Wall:         Appears nml (cord
insert, abd wall)
Nuchal Fold:           Not applicable (>20    Cord Vessels:           Appears normal (3
wks GA)                                        vessel cord)
Face:                  Appears normal         Kidneys:                Appear normal
(orbits and profile)
Lips:                  Appears normal         Bladder:                Appears normal
Thoracic:              Appears normal         Spine:                  Appears normal
Heart:                 Appears normal         Upper Extremities:      Appears normal
(4CH, axis, and situs
RVOT:                  Appears normal         Lower Extremities:      Appears normal
LVOT:                  Appears normal

Other:  Female gender. Nasal bone visualized.
Cervix Uterus Adnexa

Cervix
Length:           3.53  cm.
Normal appearance by transabdominal scan.

Uterus
No abnormality visualized.

Left Ovary
Not visualized. No adnexal mass visualized.

Right Ovary
Not visualized. No adnexal mass visualized.

Cul De Sac
No free fluid seen.

Adnexa
No abnormality visualized.
Impression

Ms. Limber Vicente is here for a second opinion. On your office
ultrasound, the estimated fetal weight was at the 1st
percentile. On first-trimester screening, the risks of fetal
aneuploidies were not increased.

We performed a fetal anatomy scan. No markers of
aneuploidies or fetal structural defects are seen. Fetal
biometry is consistent with her previously-established dates.
The estimated fetal weight is at the 53rd percentile. Amniotic
fluid is normal and good fetal activity is seen. Patient
understands the limitations of ultrasound in detecting fetal
anomalies.
I reassured the patient of normal fetal growth.
Recommendations

-An appointment was made for her to return in 4 weeks to
assess interval growth.

## 2022-10-09 ENCOUNTER — Emergency Department (HOSPITAL_COMMUNITY)
Admission: EM | Admit: 2022-10-09 | Discharge: 2022-10-10 | Disposition: A | Discharge status: Home / Self Care | Payer: Medicaid Other | Attending: Emergency Medicine | Admitting: Emergency Medicine

## 2022-10-09 ENCOUNTER — Encounter (HOSPITAL_COMMUNITY): Payer: Self-pay

## 2022-10-09 ENCOUNTER — Other Ambulatory Visit: Payer: Self-pay

## 2022-10-09 DIAGNOSIS — B9789 Other viral agents as the cause of diseases classified elsewhere: Secondary | ICD-10-CM

## 2022-10-09 DIAGNOSIS — R0981 Nasal congestion: Secondary | ICD-10-CM | POA: Insufficient documentation

## 2022-10-09 DIAGNOSIS — R509 Fever, unspecified: Secondary | ICD-10-CM | POA: Diagnosis present

## 2022-10-09 DIAGNOSIS — Z1152 Encounter for screening for COVID-19: Secondary | ICD-10-CM | POA: Insufficient documentation

## 2022-10-09 DIAGNOSIS — J029 Acute pharyngitis, unspecified: Secondary | ICD-10-CM | POA: Diagnosis not present

## 2022-10-09 DIAGNOSIS — R112 Nausea with vomiting, unspecified: Secondary | ICD-10-CM | POA: Insufficient documentation

## 2022-10-09 DIAGNOSIS — B349 Viral infection, unspecified: Secondary | ICD-10-CM

## 2022-10-09 LAB — URINALYSIS, ROUTINE W REFLEX MICROSCOPIC
Bacteria, UA: NONE SEEN
Bilirubin Urine: NEGATIVE
Glucose, UA: NEGATIVE mg/dL
Ketones, ur: 20 mg/dL — AB
Nitrite: NEGATIVE
Protein, ur: >=300 mg/dL — AB
Specific Gravity, Urine: 1.025 (ref 1.005–1.030)
pH: 8 (ref 5.0–8.0)

## 2022-10-09 LAB — CBC WITH DIFFERENTIAL/PLATELET
Abs Immature Granulocytes: 0.05 10*3/uL (ref 0.00–0.07)
Basophils Absolute: 0.1 10*3/uL (ref 0.0–0.1)
Basophils Relative: 1 %
Eosinophils Absolute: 0 10*3/uL (ref 0.0–0.5)
Eosinophils Relative: 0 %
HCT: 34 % — ABNORMAL LOW (ref 36.0–46.0)
Hemoglobin: 11.3 g/dL — ABNORMAL LOW (ref 12.0–15.0)
Immature Granulocytes: 1 %
Lymphocytes Relative: 13 %
Lymphs Abs: 1.3 10*3/uL (ref 0.7–4.0)
MCH: 26.1 pg (ref 26.0–34.0)
MCHC: 33.2 g/dL (ref 30.0–36.0)
MCV: 78.5 fL — ABNORMAL LOW (ref 80.0–100.0)
Monocytes Absolute: 1.6 10*3/uL — ABNORMAL HIGH (ref 0.1–1.0)
Monocytes Relative: 15 %
Neutro Abs: 7.3 10*3/uL (ref 1.7–7.7)
Neutrophils Relative %: 70 %
Platelets: 201 10*3/uL (ref 150–400)
RBC: 4.33 MIL/uL (ref 3.87–5.11)
RDW: 15 % (ref 11.5–15.5)
WBC: 10.4 10*3/uL (ref 4.0–10.5)
nRBC: 0 % (ref 0.0–0.2)

## 2022-10-09 LAB — COMPREHENSIVE METABOLIC PANEL
ALT: 30 U/L (ref 0–44)
AST: 33 U/L (ref 15–41)
Albumin: 3.3 g/dL — ABNORMAL LOW (ref 3.5–5.0)
Alkaline Phosphatase: 62 U/L (ref 38–126)
Anion gap: 10 (ref 5–15)
BUN: 9 mg/dL (ref 6–20)
CO2: 19 mmol/L — ABNORMAL LOW (ref 22–32)
Calcium: 8.9 mg/dL (ref 8.9–10.3)
Chloride: 103 mmol/L (ref 98–111)
Creatinine, Ser: 1.04 mg/dL — ABNORMAL HIGH (ref 0.44–1.00)
GFR, Estimated: >60 mL/min (ref >60)
Glucose, Bld: 115 mg/dL — ABNORMAL HIGH (ref 70–99)
Potassium: 3.8 mmol/L (ref 3.5–5.1)
Sodium: 132 mmol/L — ABNORMAL LOW (ref 135–145)
Total Bilirubin: 0.8 mg/dL (ref 0.3–1.2)
Total Protein: 7.6 g/dL (ref 6.5–8.1)

## 2022-10-09 LAB — I-STAT BETA HCG BLOOD, ED (MC, WL, AP ONLY): I-stat hCG, quantitative: <5 m[IU]/mL (ref <5)

## 2022-10-09 LAB — RESP PANEL BY RT-PCR (FLU A&B, COVID) ARPGX2
Influenza A by PCR: NEGATIVE
Influenza B by PCR: NEGATIVE
SARS Coronavirus 2 by RT PCR: NEGATIVE

## 2022-10-09 LAB — MONONUCLEOSIS SCREEN: Mono Screen: NEGATIVE

## 2022-10-09 LAB — GROUP A STREP BY PCR: Group A Strep by PCR: NOT DETECTED

## 2022-10-09 MED ORDER — LACTATED RINGERS IV BOLUS
1000.0000 mL | Freq: Once | INTRAVENOUS | Status: AC
  Administered 2022-10-09: 1000 mL via INTRAVENOUS

## 2022-10-09 MED ORDER — DEXAMETHASONE SODIUM PHOSPHATE 10 MG/ML IJ SOLN
10.0000 mg | Freq: Once | INTRAMUSCULAR | Status: AC
  Administered 2022-10-09: 10 mg via INTRAVENOUS
  Filled 2022-10-09: qty 1

## 2022-10-09 MED ORDER — ACETAMINOPHEN 325 MG PO TABS: 650.0000 mg | ORAL_TABLET | Freq: Once | ORAL | Status: DC

## 2022-10-09 MED ORDER — LIDOCAINE VISCOUS HCL 2 % MT SOLN
15.0000 mL | Freq: Once | OROMUCOSAL | Status: AC
  Administered 2022-10-09: 15 mL via OROMUCOSAL
  Filled 2022-10-09: qty 15

## 2022-10-09 MED ORDER — ACETAMINOPHEN 500 MG PO TABS
1000.0000 mg | ORAL_TABLET | Freq: Once | ORAL | Status: AC
  Administered 2022-10-09: 1000 mg via ORAL
  Filled 2022-10-09: qty 2

## 2022-10-09 MED ORDER — ONDANSETRON HCL 4 MG/2ML IJ SOLN
4.0000 mg | Freq: Once | INTRAMUSCULAR | Status: AC
  Administered 2022-10-09: 4 mg via INTRAVENOUS
  Filled 2022-10-09: qty 2

## 2022-10-09 NOTE — Discharge Instructions (Signed)
Lab work was all reassuring, please continue with the antibiotics that are prescribed to you, I recommend ibuprofen Tylenol as needed for pain and fever control, I recommend throat lozenges tea and honey as this will help soothe your throat.  Please follow-up with your PCP as needed  Please come back if you develop tongue throat lip swelling difficulty breathing unable to swallow your own saliva as you will need further evaluation.

## 2022-10-09 NOTE — ED Provider Notes (Signed)
[facility]-EMERGENCY DEPT Provider Note   CSN: [id number] Arrival date & time: 10/09/22  1829     History  Chief Complaint  Patient presents with   Generalized Body Aches   Fever   Nasal Congestion   Sore Throat   Emesis    Renee Glenn is a 26 y.o. female.  With past medical history of asthma who presents to the emergency department with sore throat, fever, body aches.  Patient states that symptoms began on Friday.  She states that she began having sore throat.  This progressed over the weekend to having congestion, fever, body aches, nausea and vomiting.  She has had limited p.o. intake.  Is able to tolerate liquids just with pain.  She denies any chest pain, shortness of breath, dysuria, vaginal discharge, neck pain or stiffness, diarrhea.  Denies sick contacts.   Fever Associated symptoms: congestion, myalgias, nausea, sore throat and vomiting   Associated symptoms: no chills and no dysuria   Sore Throat  Emesis Associated symptoms: fever, myalgias and sore throat   Associated symptoms: no chills        Home Medications Prior to Admission medications   Medication Sig Start Date End Date Taking? Authorizing Provider  amLODipine (NORVASC) 5 MG tablet Take 1 tablet (5 mg total) by mouth daily. 12/06/18 01/05/19  Peiffer, Shawn Route, MD  ibuprofen (ADVIL,MOTRIN) 600 MG tablet Take 1 tablet (600 mg total) by mouth every 6 (six) hours. 12/06/18   Peiffer, Shawn Route, MD  Prenatal Vit-Fe Fumarate-FA (PRENATAL MULTIVITAMIN) TABS tablet Take 1 tablet by mouth daily at 12 noon.    [provider]      Allergies    Penicillins    Review of Systems   Review of Systems  Constitutional:  Positive for fever. Negative for chills.  HENT:  Positive for congestion and sore throat.   Gastrointestinal:  Positive for nausea and vomiting.  Genitourinary:  Negative for dysuria and vaginal discharge.  Musculoskeletal:  Positive for myalgias.  All other  systems reviewed and are negative.   Physical Exam Updated Vital Signs BP 122/71   Pulse (!) 121   Temp (!) 102.9 F (39.4 C) (Oral)   Resp 20   SpO2 98%  Physical Exam Vitals and nursing note reviewed.  Constitutional:      Appearance: Normal appearance. She is ill-appearing.  HENT:     Head: Normocephalic.     Nose: Congestion present.     Mouth/Throat:     Mouth: Mucous membranes are moist.     Pharynx: Pharyngeal swelling, oropharyngeal exudate, posterior oropharyngeal erythema and uvula swelling present.     Tonsils: Tonsillar exudate present. 2+ on the right. 2+ on the left.  Eyes:     General: No scleral icterus. Cardiovascular:     Rate and Rhythm: Regular rhythm. Tachycardia present.     Heart sounds: Normal heart sounds. No murmur heard. Pulmonary:     Effort: Pulmonary effort is normal. No respiratory distress.     Breath sounds: Normal breath sounds.  Abdominal:     General: Bowel sounds are normal. There is no distension.     Palpations: Abdomen is soft.     Tenderness: There is no abdominal tenderness.  Musculoskeletal:     Cervical back: Neck supple.  Lymphadenopathy:     Cervical: Cervical adenopathy present.  Skin:    General: Skin is warm and dry.     Capillary Refill: Capillary refill takes less than 2 seconds.  Neurological:     General: No focal deficit present.     Mental Status: She is alert and oriented to person, place, and time.  Psychiatric:        Mood and Affect: Mood normal.        Behavior: Behavior normal.    ED Results / Procedures / Treatments   Labs (all labs ordered are listed, but only abnormal results are displayed) Labs Reviewed  COMPREHENSIVE METABOLIC PANEL - Abnormal; Notable for the following components:      Result Value   Sodium 132 (*)    CO2 19 (*)    Glucose, Bld 115 (*)    Creatinine, Ser 1.04 (*)    Albumin 3.3 (*)    All other components within normal limits  CBC WITH DIFFERENTIAL/PLATELET - Abnormal;  Notable for the following components:   Hemoglobin 11.3 (*)    HCT 34.0 (*)    MCV 78.5 (*)    Monocytes Absolute 1.6 (*)    All other components within normal limits  GROUP A STREP BY PCR  RESP PANEL BY RT-PCR (FLU A&B, COVID) [id number]  URINALYSIS, ROUTINE W REFLEX MICROSCOPIC  I-STAT BETA HCG BLOOD, ED ([facility], [facility], [facility] ONLY)   EKG None  Radiology No results found.  Procedures Procedures   Medications Ordered in ED Medications  acetaminophen (TYLENOL) tablet 1,000 mg (1,000 mg Oral Given 10/09/22 2051)  lactated ringers bolus 1,000 mL (1,000 mLs Intravenous New Bag/Given 10/09/22 2050)  ondansetron (ZOFRAN) injection 4 mg (4 mg Intravenous Given 10/09/22 2051)  dexamethasone (DECADRON) injection 10 mg (10 mg Intravenous Given 10/09/22 2057)  lidocaine (XYLOCAINE) 2 % viscous mouth solution 15 mL (15 mLs Mouth/Throat Given 10/09/22 2057)    ED Course/ Medical Decision Making/ A&P                           Medical Decision Making Amount and/or Complexity of Data Reviewed Labs: ordered.  Risk Prescription drug management.  Care of patient being handed off to [place], [place]. Patient is pending mono and gonorrhea testing. She appears improved after fluids and symptomatic treatment. COVID, flu, strep is negative. She does have significant tonsillar swelling with exudate. Tolerating liquids. No airway compromise. She will need tx for tonsillitis with Augmentin. Will also cover for GC/chlamydia even though she does not have vaginal discharge. Last oral sex was 2 weeks ago. Will cover empirically if mono is negative.  Final Clinical Impression(s) / ED Diagnoses Final diagnoses:  None    Rx / DC Orders ED Discharge Orders     None         Mickie Hillier, PA-C 10/09/22 2214    Drenda Freeze, MD 10/10/22 1500

## 2022-10-09 NOTE — ED Provider Notes (Cosign Needed Addendum)
Received at shift change from Magdalene Patricia, PA-C please see her note for full detail  Patient without significant medical history presents with complaints of a sore throat general body aches nasal congestion and feeling fatigue states has been going on since Friday, she states that she has had some nausea and vomiting denies hematochezia coffee-ground emesis stool still passing gas having normal bowel movements denies melena or hematochezia, she denies any tongue throat lip swelling difficulty breathing, denies any chest pain or shortness of breath, states she has had subjective fevers and chills, she is unaware of any recent sick contacts she is not immunocompromise, she has not gotten her COVID vaccine, she states she works in a [employer].  She had went to her primary care doctor already were there exam was unremarkable start her on azithromycin she is back here because she is still not feeling well.  She has been on azithromycin for proxy 2 days.  Not nursing any UTI-like symptoms denies any vaginal discharge vaginal bleeding, denies any recent oral sex at this time states she had a few weeks ago.  Per previous provider follow-up on lab work discharged home on antibiotics. Physical Exam  BP 125/70   Pulse (!) 104   Temp 99.7 F (37.6 C) (Oral)   Resp 20   SpO2 100%   Physical Exam Vitals and nursing note reviewed.  Constitutional:      General: She is not in acute distress.    Appearance: She is not ill-appearing.  HENT:     Head: Normocephalic and atraumatic.     Nose: No congestion.     Mouth/Throat:     Mouth: Mucous membranes are moist.     Pharynx: Oropharyngeal exudate and posterior oropharyngeal erythema present.     Comments: No trismus no torticollis no oral edema present, tongue uvula midline controlling oral secretions tonsils are both equal symmetric bilaterally, she has some erythema and exudates present on both tonsils, there is no submandibular swelling no muffled  tone voice. Eyes:     Conjunctiva/sclera: Conjunctivae normal.  Cardiovascular:     Rate and Rhythm: Normal rate and regular rhythm.     Pulses: Normal pulses.     Heart sounds: No murmur heard.    No friction rub. No gallop.  Pulmonary:     Effort: Pulmonary effort is normal.  Skin:    General: Skin is warm and dry.  Neurological:     Mental Status: She is alert.  Psychiatric:        Mood and Affect: Mood normal.     Procedures  Procedures  ED Course / MDM    Medical Decision Making    Lab Tests:  I Ordered, and personally interpreted labs.  The pertinent results include: CBC shows neuropsych anemia hemoglobin 11.3, CMP shows sodium 132 CO2 of 19 glucose 115 creatinine 1.0, UA shows small leukocytes red blood cells white blood cells no bacteria strep test is negative, respiratory panel negative, mono and GC chlamydia pending at this time   Imaging Studies ordered:  I ordered imaging studies including N/A I independently visualized and interpreted imaging which showed N/A I agree with the radiologist interpretation   Cardiac Monitoring:  The patient was maintained on a cardiac monitor.  I personally viewed and interpreted the cardiac monitored which showed an underlying rhythm of: N/A   Medicines ordered and prescription drug management:  I ordered medication including N/A I have reviewed the patients home medicines and have made adjustments as needed  Critical Interventions:  N/A   Reevaluation:  Patient was reassessed she is resting comfortably having no complaints vital signs are reassuring, she is agreement with plan and discharge at this time.  Consultations Obtained:  N/a    Test Considered:  CT maxillofacial-deferred to my suspicion for retropharyngeal/peritonsillar abscess is low at this time.    Rule out I have low suspicion for peritonsillar abscess, retropharyngeal abscess, or Ludwig angina as oropharynx was visualized tongue and  uvula were both midline, no muffled tone voice present. Low suspicion for periorbital or orbital cellulitis as patient face had no erythematous, patient EOMs were intact, he had no pain with eye movement.  I will suspicion for systemic infection as patient nontoxic-appearing she appeared febrile as well as tachycardic but this is since resolved on giving her fluids and pain medication.  I suspect some of her tachycardia is from dehydration she does endorse vomiting and has low CO2.  She is tolerating p.o. she is having no complaints at this time.  I doubt disseminated gonorrhea she has no systemic rash, presentation is atypical etiology endorsing any STD symptoms.     Dispostion and problem list  After consideration of the diagnostic results and the patients response to treatment, I feel that the patent would benefit from discharge.  Viral URI-suspect this is the cause of her symptoms, I have low suspicion for bacterial infection at this time, she is currently on azithromycin we will have her continue with this, recommend symptom management, follow-up with PCP for further evaluation.  Will defer on treating her for this having gonorrhea until her cultures come back as my suspicions are low at this time.Carroll Sage, PA-C 10/09/22 2331    Carroll Sage, PA-C 10/10/22 0003    Charlynne Pander, MD 10/10/22 [phone]

## 2022-10-09 NOTE — ED Triage Notes (Addendum)
Pt reports with generalized body aches, fever, vomiting, runny nose, vomiting, sore throat, and headaches since Friday. Pts throat is swollen on both sides.

## 2022-10-10 LAB — GC/CHLAMYDIA PROBE AMP (~~LOC~~) NOT AT ARMC
Chlamydia: NEGATIVE
Comment: NEGATIVE
Comment: NORMAL
Neisseria Gonorrhea: NEGATIVE

## 2022-10-12 LAB — GONOCOCCUS CULTURE

## 2022-10-13 ENCOUNTER — Telehealth (HOSPITAL_BASED_OUTPATIENT_CLINIC_OR_DEPARTMENT_OTHER): Payer: Self-pay

## 2022-10-13 NOTE — Telephone Encounter (Signed)
Post ED Visit - Positive Culture Follow-up  Culture report reviewed by antimicrobial stewardship pharmacist: [facility] Team []  Elenor Quinones, Pharm.D. []  Heide Guile, Pharm.D., BCPS AQ-ID []  Parks Neptune, Pharm.D., BCPS []  Alycia Rossetti, Pharm.D., BCPS []  Makoti, Pharm.D., BCPS, AAHIVP []  Legrand Como, Pharm.D., BCPS, AAHIVP []  Salome Arnt, PharmD, BCPS []  Johnnette Gourd, PharmD, BCPS []  Hughes Better, PharmD, BCPS []  Leeroy Cha, PharmD []  Laqueta Linden, PharmD, BCPS []  Albertina Parr, PharmD  [facility] Team []  Leodis Sias, PharmD []  Lindell Spar, PharmD []  Royetta Asal, PharmD []  Graylin Shiver, Rph []  Rema Fendt) Glennon Mac, PharmD []  Arlyn Dunning, PharmD []  Netta Cedars, PharmD [x]  Dia Sitter, PharmD []  Leone Haven, PharmD []  Gretta Arab, PharmD []  Theodis Shove, PharmD []  Peggyann Juba, PharmD []  Reuel Boom, PharmD  Negative Gonococcus culture No treatment and no further patient follow-up is required at this time.  Glennon Hamilton 10/13/2022, 8:57 AM

## 2024-01-15 ENCOUNTER — Encounter: Payer: Self-pay | Admitting: Obstetrics and Gynecology

## 2024-01-15 ENCOUNTER — Ambulatory Visit: Payer: Medicaid Other | Admitting: Obstetrics and Gynecology

## 2024-01-15 VITALS — BP 143/92 | HR 68 | Ht 66.0 in | Wt 228.0 lb

## 2024-01-15 DIAGNOSIS — Z975 Presence of (intrauterine) contraceptive device: Secondary | ICD-10-CM | POA: Diagnosis not present

## 2024-01-15 DIAGNOSIS — Z3009 Encounter for other general counseling and advice on contraception: Secondary | ICD-10-CM | POA: Diagnosis not present

## 2024-01-15 NOTE — Progress Notes (Signed)
   GYNECOLOGY PROGRESS NOTE  History:  28 y.o. G1P1001 presents to [facility] for possible IUD removal/reinsert. Had Mirena IUD placed in 2020. Reports no concers, no vaginal or pelvic complaints. Was assessed in December when having pap done and was reported to be in place. Reports monthly light cycle. Would like to continue with it. Pap smear done with [facility] 2024  The following portions of the patient's history were reviewed and updated as appropriate: allergies, current medications, past family history, past medical history, past social history, past surgical history and problem list. Last pap smear on 12/09/23 reports as normal  Health Maintenance Due  Topic Date Due   Hepatitis C Screening  Never done   DTaP/Tdap/Td (1 - Tdap) Never done   Cervical Cancer Screening (Pap smear)  Never done   INFLUENZA VACCINE  Never done   COVID-19 Vaccine (1 - 2024-25 season) Never done     Review of Systems:  Pertinent items are noted in HPI.   Objective:  Physical Exam Blood pressure (!) 143/92, pulse 68, height 5\' 6"  (1.676 m), weight 228 lb (103.4 kg), unknown if currently breastfeeding. VS reviewed, nursing note reviewed,  Constitutional: well developed, well nourished, no distress HEENT: normocephalic CV: normal rate Pulm/chest wall: normal effort Breast Exam: deferred Abdomen: soft Neuro: alert and oriented Skin: warm, dry Psych: affect normal Pelvic exam: deferred  Assessment & Plan:  1. IUD (intrauterine device) in place (Primary) Continue with IUD, discussed removal and reinsertion 2028 if desires. Notify if she would like removal before than or any pelvic or vaginal complaints.  Follows with PCP, continue routine care UTD on pap with PCP Requesting pap and iud records today    Return in about 1 year (around 01/14/2025), or ROI from [employer] [facility], for [employer], FNP

## 2024-01-15 NOTE — Progress Notes (Signed)
Pt presents for IUD removal and reinsertion  Mirena IUD placed 12/2018 Last PAP 12-09-23

## 2024-12-15 ENCOUNTER — Encounter (HOSPITAL_COMMUNITY): Payer: Self-pay

## 2024-12-15 ENCOUNTER — Other Ambulatory Visit: Payer: Self-pay

## 2024-12-15 ENCOUNTER — Emergency Department (HOSPITAL_COMMUNITY)

## 2024-12-15 ENCOUNTER — Emergency Department (HOSPITAL_COMMUNITY)
Admission: EM | Admit: 2024-12-15 | Discharge: 2024-12-15 | Disposition: A | Discharge status: Home / Self Care | Attending: Emergency Medicine | Admitting: Emergency Medicine

## 2024-12-15 DIAGNOSIS — J069 Acute upper respiratory infection, unspecified: Secondary | ICD-10-CM

## 2024-12-15 DIAGNOSIS — R059 Cough, unspecified: Secondary | ICD-10-CM | POA: Diagnosis not present

## 2024-12-15 DIAGNOSIS — J101 Influenza due to other identified influenza virus with other respiratory manifestations: Secondary | ICD-10-CM | POA: Diagnosis not present

## 2024-12-15 DIAGNOSIS — R0981 Nasal congestion: Secondary | ICD-10-CM | POA: Diagnosis present

## 2024-12-15 DIAGNOSIS — R000 Tachycardia, unspecified: Secondary | ICD-10-CM | POA: Diagnosis not present

## 2024-12-15 LAB — CBC WITH DIFFERENTIAL/PLATELET
Abs Immature Granulocytes: 0.04 K/uL (ref 0.00–0.07)
Basophils Absolute: 0 K/uL (ref 0.0–0.1)
Basophils Relative: 0 %
Eosinophils Absolute: 0 K/uL (ref 0.0–0.5)
Eosinophils Relative: 0 %
HCT: 33.4 % — ABNORMAL LOW (ref 36.0–46.0)
Hemoglobin: 10.8 g/dL — ABNORMAL LOW (ref 12.0–15.0)
Immature Granulocytes: 0 %
Lymphocytes Relative: 6 %
Lymphs Abs: 0.6 K/uL — ABNORMAL LOW (ref 0.7–4.0)
MCH: 26.6 pg (ref 26.0–34.0)
MCHC: 32.3 g/dL (ref 30.0–36.0)
MCV: 82.3 fL (ref 80.0–100.0)
Monocytes Absolute: 1.4 K/uL — ABNORMAL HIGH (ref 0.1–1.0)
Monocytes Relative: 13 %
Neutro Abs: 8 K/uL — ABNORMAL HIGH (ref 1.7–7.7)
Neutrophils Relative %: 81 %
Platelets: 188 K/uL (ref 150–400)
RBC: 4.06 MIL/uL (ref 3.87–5.11)
RDW: 14.6 % (ref 11.5–15.5)
WBC: 10.1 K/uL (ref 4.0–10.5)
nRBC: 0 % (ref 0.0–0.2)

## 2024-12-15 LAB — COMPREHENSIVE METABOLIC PANEL WITH GFR
ALT: 18 U/L (ref 0–44)
AST: 20 U/L (ref 15–41)
Albumin: 4 g/dL (ref 3.5–5.0)
Alkaline Phosphatase: 73 U/L (ref 38–126)
Anion gap: 9 (ref 5–15)
BUN: 5 mg/dL — ABNORMAL LOW (ref 6–20)
CO2: 20 mmol/L — ABNORMAL LOW (ref 22–32)
Calcium: 9 mg/dL (ref 8.9–10.3)
Chloride: 103 mmol/L (ref 98–111)
Creatinine, Ser: 0.92 mg/dL (ref 0.44–1.00)
GFR, Estimated: >60 mL/min
Glucose, Bld: 120 mg/dL — ABNORMAL HIGH (ref 70–99)
Potassium: 3.4 mmol/L — ABNORMAL LOW (ref 3.5–5.1)
Sodium: 132 mmol/L — ABNORMAL LOW (ref 135–145)
Total Bilirubin: 0.2 mg/dL (ref 0.0–1.2)
Total Protein: 7.1 g/dL (ref 6.5–8.1)

## 2024-12-15 LAB — RESP PANEL BY RT-PCR (RSV, FLU A&B, COVID)  RVPGX2
Influenza A by PCR: POSITIVE — AB
Influenza B by PCR: NEGATIVE
Resp Syncytial Virus by PCR: NEGATIVE
SARS Coronavirus 2 by RT PCR: NEGATIVE

## 2024-12-15 LAB — D-DIMER, QUANTITATIVE: D-Dimer, Quant: 0.32 ug{FEU}/mL (ref 0.00–0.50)

## 2024-12-15 MED ORDER — BENZONATATE 100 MG PO CAPS: 100.0000 mg | ORAL_CAPSULE | Freq: Three times a day (TID) | ORAL | 0 refills | Status: AC | PRN

## 2024-12-15 MED ORDER — POTASSIUM CHLORIDE CRYS ER 20 MEQ PO TBCR
20.0000 meq | EXTENDED_RELEASE_TABLET | Freq: Once | ORAL | Status: AC
  Administered 2024-12-15: 20 meq via ORAL
  Filled 2024-12-15: qty 1

## 2024-12-15 MED ORDER — IBUPROFEN 800 MG PO TABS
800.0000 mg | ORAL_TABLET | Freq: Once | ORAL | Status: AC
  Administered 2024-12-15: 800 mg via ORAL
  Filled 2024-12-15: qty 1

## 2024-12-15 MED ORDER — LACTATED RINGERS IV BOLUS
1000.0000 mL | Freq: Once | INTRAVENOUS | Status: AC
  Administered 2024-12-15: 1000 mL via INTRAVENOUS

## 2024-12-15 MED ORDER — OSELTAMIVIR PHOSPHATE 75 MG PO CAPS: 75.0000 mg | ORAL_CAPSULE | Freq: Two times a day (BID) | ORAL | 0 refills | Status: AC

## 2024-12-15 MED ORDER — ALBUTEROL SULFATE HFA 108 (90 BASE) MCG/ACT IN AERS: 2.0000 | INHALATION_SPRAY | Freq: Four times a day (QID) | RESPIRATORY_TRACT | 0 refills | Status: AC | PRN

## 2024-12-15 MED ORDER — ACETAMINOPHEN 500 MG PO TABS
1000.0000 mg | ORAL_TABLET | Freq: Once | ORAL | Status: AC
  Administered 2024-12-15: 1000 mg via ORAL
  Filled 2024-12-15: qty 2

## 2024-12-15 NOTE — ED Provider Notes (Signed)
 " [facility] EMERGENCY DEPARTMENT AT [facility] Provider Note   CSN: [id number] Arrival date & time: 12/15/24  9349     Patient presents with: Cough   Renee Glenn is a 28 y.o. female with a history of asthma who presents to the ED with flulike symptoms that began 3 days ago. The symptoms started as a productive cough and have been progressively worsening since onset. The patient describes the symptoms as nasal and chest congestion, sore throat, cough, fever, and myalgias. The patient reports mild shortness of breath on ambulation. No chest pain. The patient reports no nausea, vomiting, and diarrhea. No recent travel. 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 Patient is in no acute distress.    Cough      Prior to Admission medications  Medication Sig Start Date End Date Taking? Authorizing Provider  albuterol  (VENTOLIN  HFA) 108 (90 Base) MCG/ACT inhaler Inhale 2 puffs into the lungs every 6 (six) hours as needed for wheezing or shortness of breath. 12/15/24  Yes Vang Kraeger L, PA  benzonatate  (TESSALON ) 100 MG capsule Take 1 capsule (100 mg total) by mouth 3 (three) times daily as needed for up to 7 days for cough. 12/15/24 12/22/24 Yes Apoorva Bugay L, PA  oseltamivir  (TAMIFLU ) 75 MG capsule Take 1 capsule (75 mg total) by mouth 2 (two) times daily for 5 days. 12/15/24 12/20/24 Yes Deicy Rusk L, PA  amLODipine  (NORVASC ) 5 MG tablet Take 1 tablet (5 mg total) by mouth daily. 12/06/18 01/05/19  Peiffer, Lauraine LABOR, MD  ibuprofen  (ADVIL ,MOTRIN ) 600 MG tablet Take 1 tablet (600 mg total) by mouth every 6 (six) hours. 12/06/18   Peiffer, Lauraine LABOR, MD  ondansetron  (ZOFRAN ) 4 MG tablet Take 4 mg by mouth every 8 (eight) hours as needed for nausea or vomiting.    [provider]  Prenatal Vit-Fe Fumarate-FA (PRENATAL MULTIVITAMIN) TABS tablet Take 1 tablet by mouth daily at 12 noon.    [provider]  topiramate (TOPAMAX) 15 MG  capsule Take 15 mg by mouth 2 (two) times daily.    [provider]    Allergies: Penicillins    Review of Systems  Respiratory:  Positive for cough.     Updated Vital Signs BP 134/76 (BP Location: Right Arm)   Pulse 100   Temp 100.3 F (37.9 C) (Oral)   Resp 20   Ht 5' 6 (1.676 m)   Wt 96.2 kg   SpO2 100%   BMI 34.22 kg/m   Physical Exam Vitals and nursing note reviewed.  Constitutional:      General: She is not in acute distress.    Appearance: Normal appearance.  HENT:     Head: Normocephalic and atraumatic.     Right Ear: Tympanic membrane and ear canal normal.     Left Ear: Tympanic membrane and ear canal normal.     Nose: Congestion and rhinorrhea present. Rhinorrhea is clear.     Mouth/Throat:     Mouth: Mucous membranes are moist.     Pharynx: Uvula midline. Posterior oropharyngeal erythema present. No pharyngeal swelling, oropharyngeal exudate or uvula swelling.     Tonsils: No tonsillar exudate or tonsillar abscesses.  Eyes:     Extraocular Movements: Extraocular movements intact.     Conjunctiva/sclera: Conjunctivae normal.     Pupils: Pupils are equal, round, and reactive to light.  Cardiovascular:     Rate and Rhythm: Regular rhythm. Tachycardia present.  Pulses: Normal pulses.          Radial pulses are 2+ on the right side.  Pulmonary:     Effort: Pulmonary effort is normal. No respiratory distress.     Breath sounds: Normal breath sounds. No wheezing.     Comments: Patient has no difficulty speaking complete sentences.  Patient has productive cough on exam with no difficulty clearing secretions. Abdominal:     General: Abdomen is flat.     Palpations: Abdomen is soft.     Tenderness: There is no abdominal tenderness.  Musculoskeletal:        General: Normal range of motion.     Cervical back: Normal range of motion.  Lymphadenopathy:     Cervical: Cervical adenopathy present.  Skin:    General: Skin is warm and dry.     Capillary  Refill: Capillary refill takes less than 2 seconds.  Neurological:     General: No focal deficit present.     Mental Status: She is alert. Mental status is at baseline.  Psychiatric:        Mood and Affect: Mood normal.     (all labs ordered are listed, but only abnormal results are displayed) Labs Reviewed  RESP PANEL BY RT-PCR (RSV, FLU A&B, COVID)  RVPGX2 - Abnormal; Notable for the following components:      Result Value   Influenza A by PCR POSITIVE (*)    All other components within normal limits  CBC WITH DIFFERENTIAL/PLATELET - Abnormal; Notable for the following components:   Hemoglobin 10.8 (*)    HCT 33.4 (*)    Neutro Abs 8.0 (*)    Lymphs Abs 0.6 (*)    Monocytes Absolute 1.4 (*)    All other components within normal limits  COMPREHENSIVE METABOLIC PANEL WITH GFR - Abnormal; Notable for the following components:   Sodium 132 (*)    Potassium 3.4 (*)    CO2 20 (*)    Glucose, Bld 120 (*)    BUN 5 (*)    All other components within normal limits  D-DIMER, QUANTITATIVE    EKG: None  Radiology: DG Chest Portable 1 View Result Date: 12/15/2024 CLINICAL DATA:  Cough and fatigue.  Body aches. EXAM: PORTABLE CHEST 1 VIEW COMPARISON:  No comparison studies available. FINDINGS: The heart size and mediastinal contours are within normal limits. Both lungs are clear. The visualized skeletal structures are unremarkable. IMPRESSION: No active disease. Electronically Signed   By: Camellia Candle M.D.   On: 12/15/2024 08:25     Procedures   Medications Ordered in the ED  ibuprofen  (ADVIL ) tablet 800 mg (800 mg Oral Given 12/15/24 0756)  lactated ringers  bolus 1,000 mL (0 mLs Intravenous Stopped 12/15/24 0940)  acetaminophen  (TYLENOL ) tablet 1,000 mg (1,000 mg Oral Given 12/15/24 0956)  potassium chloride  SA (KLOR-CON  M) CR tablet 20 mEq (20 mEq Oral Given 12/15/24 0956)                                  Medical Decision Making Amount and/or Complexity of Data  Reviewed Labs: ordered. Decision-making details documented in ED Course. Radiology: ordered. Decision-making details documented in ED Course. ECG/medicine tests:  Decision-making details documented in ED Course.  Risk OTC drugs. Prescription drug management.   Patient presents to the ED for: Flulike symptoms This involves an extensive number of treatment options Differential diagnosis includes:  Infectious etiology PE Co-morbid conditions:  Asthma  Clinical Course as of 12/15/24 1003  Tue Dec 15, 2024  0737 Temp: 99.9 F (37.7 C) Febrile, tachycardic 130, patient in no acute distress [ML]  0801 To trial ibuprofen  800mg  and IV fluids for HR - well tolerated [ML]  0834 DG Chest Portable 1 View Unremarkable [ML]  0834 EKG 12-Lead Sinus tachycardia [ML]  0903 Resp panel by RT-PCR (RSV, Flu A&B, Covid) Anterior Nasal Swab(!) Influenza A + [ML]  0909 CBC with Differential(!) Mild anemia - no other acute findings [ML]  0926 D-dimer, quantitative Negative  [ML]  0931 Comprehensive metabolic panel(!) Mild hypokalemia -will give supplement prior to discharge. [ML]  1001 Temp: 100.3 F (37.9 C) Tachycardia has improved - HR 100 - patient given Tylenol  dose prior to discharge for fever [ML]    Clinical Course User Index [ML] Willma Duwaine CROME, PA    Data Reviewed / Actions Taken: Labs ordered/reviewed with my independent interpretation in ED course above. Imaging ordered/reviewed with my independent interpretation in ED course above. I agree with the radiologists interpretation.  EKG ordered/reviewed with my independent interpretation in ED course above. The patient did not require continuous cardiac monitoring during the ED stay.  Management / Treatments: See ED course above for medications, treatments administered, and clinical rationale.   Reevaluation of the patient after these medicines showed that the patient improved.  I have reviewed the patients home medicines and have made  adjustments as needed  ED Course / Reassessments: Problem List: Influenza 28 year old female presented for flulike symptoms. Initial assessment included history, physical exam, and review of prior medical records.  Given patient's tachycardia on arrival, laboratory values and imaging were obtained.  Given positive influenza A PCR test patient's symptoms likely viral in nature.  Pulmonary embolus being a causative factor of patient's shortness of breath and tachycardia unlikely given negative D-dimer testing.  Other infectious etiology requiring antibiotic treatment regimen unlikely at this time given reassuring laboratory workup and positive viral PCR panel.  Patient was noted to have chronic anemia for which she is not being followed by primary care-this was noted to patient and she is to follow-up with her PCP for further evaluation. Pain and symptoms were addressed during the visit. Vital signs were obtained and monitored, and the patient remained stable throughout the stay.  Patient mildly tachycardic upon discharge, however significantly improved from presenting 130HR on arrival - on reassessment, patient was in no acute distress and stated notable relief after medication and fluid administration and is comfortable with discharge at this time.  Patient to be discharged with supportive care and prescription regimen and close follow-up with PCP for further evaluation and care. Patient response: Improved Serial reassessments performed: Yes    Disposition: Disposition: Discharge with close follow-up with PCP for further evaluation and care. Rationale for disposition: Stable for discharge The disposition plan and rationale were discussed with the patient at the bedside, all questions were addressed, and the patient demonstrated understanding.  This note was produced using Electronics [employer]. While I have reviewed and verified all clinical information, transcription errors may remain.       Final diagnoses:  Viral upper respiratory illness    ED Discharge Orders          Ordered    oseltamivir  (TAMIFLU ) 75 MG capsule  2 times daily        12/15/24 0955    albuterol  (VENTOLIN  HFA) 108 (90 Base) MCG/ACT inhaler  Every 6 hours PRN  12/15/24 0955    benzonatate  (TESSALON ) 100 MG capsule  3 times daily PRN        12/15/24 0955               Willma Duwaine CROME, PA 12/15/24 1014    Neysa Caron PARAS, [place] 12/15/24 1528  "

## 2024-12-15 NOTE — ED Triage Notes (Signed)
 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 Pt has a cough, fatigue, body aches, and shob since Saturday.

## 2024-12-15 NOTE — Discharge Instructions (Signed)
 Thank you for visiting the Emergency Department today.  It was a pleasure to be part of your healthcare team.  Your test results were positive for influenza. As discussed, at home rest, hydrate, resume normal diet as tolerated, and utilize ibuprofen  and Tylenol  as needed for fever and pain control.  You have been prescribed Tamiflu , benzonatate , and a refill of your albuterol  inhaler - you should take your medications as directed. If you have any questions about your medicines, please call your pharmacy or healthcare provider. As discussed, it is important to watch for warning signs such as worsening pain, fever, trouble breathing, or chest pain. If any of these happen, return to the Emergency Department or call 911. Please follow up with your primary care provider within 1 to 2 weeks.  Thank you for trusting us  with your health.
